# Patient Record
Sex: Male | Born: 1965 | Race: Black or African American | Hispanic: No | Marital: Single | Smoking: Former smoker
Health system: Southern US, Community
[De-identification: ages and names within clinical notes are randomized; demographics above are authoritative.]

## PROBLEM LIST (undated history)

## (undated) DIAGNOSIS — J45909 Unspecified asthma, uncomplicated: Secondary | ICD-10-CM

## (undated) DIAGNOSIS — M1990 Unspecified osteoarthritis, unspecified site: Secondary | ICD-10-CM

## (undated) DIAGNOSIS — I10 Essential (primary) hypertension: Secondary | ICD-10-CM

## (undated) HISTORY — PX: OTHER SURGICAL HISTORY: SHX169

---

## 2013-07-13 DIAGNOSIS — M545 Low back pain, unspecified: Secondary | ICD-10-CM | POA: Diagnosis not present

## 2013-07-13 DIAGNOSIS — R1031 Right lower quadrant pain: Secondary | ICD-10-CM | POA: Diagnosis not present

## 2013-07-13 DIAGNOSIS — E039 Hypothyroidism, unspecified: Secondary | ICD-10-CM | POA: Diagnosis not present

## 2013-07-13 DIAGNOSIS — N281 Cyst of kidney, acquired: Secondary | ICD-10-CM | POA: Diagnosis not present

## 2013-07-13 DIAGNOSIS — E538 Deficiency of other specified B group vitamins: Secondary | ICD-10-CM | POA: Diagnosis not present

## 2013-07-13 DIAGNOSIS — R7989 Other specified abnormal findings of blood chemistry: Secondary | ICD-10-CM | POA: Diagnosis not present

## 2013-07-13 DIAGNOSIS — K7689 Other specified diseases of liver: Secondary | ICD-10-CM | POA: Diagnosis not present

## 2013-07-13 DIAGNOSIS — M4800 Spinal stenosis, site unspecified: Secondary | ICD-10-CM | POA: Diagnosis not present

## 2013-07-13 DIAGNOSIS — E559 Vitamin D deficiency, unspecified: Secondary | ICD-10-CM | POA: Diagnosis not present

## 2013-07-13 DIAGNOSIS — M542 Cervicalgia: Secondary | ICD-10-CM | POA: Diagnosis not present

## 2013-07-16 DIAGNOSIS — Z79899 Other long term (current) drug therapy: Secondary | ICD-10-CM | POA: Diagnosis not present

## 2013-07-16 DIAGNOSIS — M545 Low back pain, unspecified: Secondary | ICD-10-CM | POA: Diagnosis not present

## 2013-07-16 DIAGNOSIS — G894 Chronic pain syndrome: Secondary | ICD-10-CM | POA: Diagnosis not present

## 2013-07-16 DIAGNOSIS — R1031 Right lower quadrant pain: Secondary | ICD-10-CM | POA: Diagnosis not present

## 2013-07-16 DIAGNOSIS — M542 Cervicalgia: Secondary | ICD-10-CM | POA: Diagnosis not present

## 2013-07-16 DIAGNOSIS — M4800 Spinal stenosis, site unspecified: Secondary | ICD-10-CM | POA: Diagnosis not present

## 2013-07-16 DIAGNOSIS — E559 Vitamin D deficiency, unspecified: Secondary | ICD-10-CM | POA: Diagnosis not present

## 2013-07-16 DIAGNOSIS — J45909 Unspecified asthma, uncomplicated: Secondary | ICD-10-CM | POA: Diagnosis not present

## 2013-07-30 DIAGNOSIS — Z79899 Other long term (current) drug therapy: Secondary | ICD-10-CM | POA: Diagnosis not present

## 2013-07-30 DIAGNOSIS — M545 Low back pain, unspecified: Secondary | ICD-10-CM | POA: Diagnosis not present

## 2013-07-30 DIAGNOSIS — E559 Vitamin D deficiency, unspecified: Secondary | ICD-10-CM | POA: Diagnosis not present

## 2013-07-30 DIAGNOSIS — J45909 Unspecified asthma, uncomplicated: Secondary | ICD-10-CM | POA: Diagnosis not present

## 2013-07-30 DIAGNOSIS — G894 Chronic pain syndrome: Secondary | ICD-10-CM | POA: Diagnosis not present

## 2013-07-30 DIAGNOSIS — M4800 Spinal stenosis, site unspecified: Secondary | ICD-10-CM | POA: Diagnosis not present

## 2013-08-30 DIAGNOSIS — M4800 Spinal stenosis, site unspecified: Secondary | ICD-10-CM | POA: Diagnosis not present

## 2013-08-30 DIAGNOSIS — M545 Low back pain, unspecified: Secondary | ICD-10-CM | POA: Diagnosis not present

## 2013-08-30 DIAGNOSIS — J45909 Unspecified asthma, uncomplicated: Secondary | ICD-10-CM | POA: Diagnosis not present

## 2013-08-30 DIAGNOSIS — E559 Vitamin D deficiency, unspecified: Secondary | ICD-10-CM | POA: Diagnosis not present

## 2013-09-27 DIAGNOSIS — M545 Low back pain, unspecified: Secondary | ICD-10-CM | POA: Diagnosis not present

## 2013-09-27 DIAGNOSIS — M4800 Spinal stenosis, site unspecified: Secondary | ICD-10-CM | POA: Diagnosis not present

## 2013-09-27 DIAGNOSIS — J45909 Unspecified asthma, uncomplicated: Secondary | ICD-10-CM | POA: Diagnosis not present

## 2013-09-27 DIAGNOSIS — G4700 Insomnia, unspecified: Secondary | ICD-10-CM | POA: Diagnosis not present

## 2013-10-28 DIAGNOSIS — Z79899 Other long term (current) drug therapy: Secondary | ICD-10-CM | POA: Diagnosis not present

## 2013-10-28 DIAGNOSIS — J45909 Unspecified asthma, uncomplicated: Secondary | ICD-10-CM | POA: Diagnosis not present

## 2013-10-28 DIAGNOSIS — M545 Low back pain, unspecified: Secondary | ICD-10-CM | POA: Diagnosis not present

## 2013-10-28 DIAGNOSIS — G4700 Insomnia, unspecified: Secondary | ICD-10-CM | POA: Diagnosis not present

## 2013-10-28 DIAGNOSIS — M546 Pain in thoracic spine: Secondary | ICD-10-CM | POA: Diagnosis not present

## 2013-10-28 DIAGNOSIS — M4800 Spinal stenosis, site unspecified: Secondary | ICD-10-CM | POA: Diagnosis not present

## 2013-10-28 DIAGNOSIS — M542 Cervicalgia: Secondary | ICD-10-CM | POA: Diagnosis not present

## 2013-11-30 DIAGNOSIS — M545 Low back pain, unspecified: Secondary | ICD-10-CM | POA: Diagnosis not present

## 2013-11-30 DIAGNOSIS — M4800 Spinal stenosis, site unspecified: Secondary | ICD-10-CM | POA: Diagnosis not present

## 2013-11-30 DIAGNOSIS — J45909 Unspecified asthma, uncomplicated: Secondary | ICD-10-CM | POA: Diagnosis not present

## 2013-11-30 DIAGNOSIS — G4700 Insomnia, unspecified: Secondary | ICD-10-CM | POA: Diagnosis not present

## 2013-12-27 ENCOUNTER — Emergency Department (HOSPITAL_COMMUNITY): Payer: Medicare Other

## 2013-12-27 ENCOUNTER — Encounter (HOSPITAL_COMMUNITY): Payer: Self-pay | Admitting: Emergency Medicine

## 2013-12-27 ENCOUNTER — Inpatient Hospital Stay (HOSPITAL_COMMUNITY)
Admission: EM | Admit: 2013-12-27 | Discharge: 2013-12-31 | DRG: 189 | Disposition: A | Discharge status: Home / Self Care | Payer: Medicare Other | Attending: Pulmonary Disease | Admitting: Pulmonary Disease

## 2013-12-27 DIAGNOSIS — J45902 Unspecified asthma with status asthmaticus: Secondary | ICD-10-CM | POA: Diagnosis present

## 2013-12-27 DIAGNOSIS — R062 Wheezing: Secondary | ICD-10-CM | POA: Diagnosis not present

## 2013-12-27 DIAGNOSIS — I10 Essential (primary) hypertension: Secondary | ICD-10-CM | POA: Diagnosis present

## 2013-12-27 DIAGNOSIS — J4532 Mild persistent asthma with status asthmaticus: Secondary | ICD-10-CM

## 2013-12-27 DIAGNOSIS — J9600 Acute respiratory failure, unspecified whether with hypoxia or hypercapnia: Principal | ICD-10-CM | POA: Diagnosis present

## 2013-12-27 DIAGNOSIS — R0609 Other forms of dyspnea: Secondary | ICD-10-CM | POA: Diagnosis not present

## 2013-12-27 DIAGNOSIS — J9601 Acute respiratory failure with hypoxia: Secondary | ICD-10-CM

## 2013-12-27 DIAGNOSIS — R079 Chest pain, unspecified: Secondary | ICD-10-CM | POA: Diagnosis present

## 2013-12-27 DIAGNOSIS — I369 Nonrheumatic tricuspid valve disorder, unspecified: Secondary | ICD-10-CM | POA: Diagnosis not present

## 2013-12-27 DIAGNOSIS — G8929 Other chronic pain: Secondary | ICD-10-CM | POA: Diagnosis present

## 2013-12-27 DIAGNOSIS — Z4682 Encounter for fitting and adjustment of non-vascular catheter: Secondary | ICD-10-CM | POA: Diagnosis not present

## 2013-12-27 DIAGNOSIS — R0989 Other specified symptoms and signs involving the circulatory and respiratory systems: Secondary | ICD-10-CM | POA: Diagnosis not present

## 2013-12-27 DIAGNOSIS — Z6832 Body mass index (BMI) 32.0-32.9, adult: Secondary | ICD-10-CM

## 2013-12-27 DIAGNOSIS — R609 Edema, unspecified: Secondary | ICD-10-CM | POA: Diagnosis present

## 2013-12-27 DIAGNOSIS — R0603 Acute respiratory distress: Secondary | ICD-10-CM

## 2013-12-27 DIAGNOSIS — J45901 Unspecified asthma with (acute) exacerbation: Secondary | ICD-10-CM | POA: Diagnosis present

## 2013-12-27 DIAGNOSIS — Z981 Arthrodesis status: Secondary | ICD-10-CM | POA: Diagnosis not present

## 2013-12-27 DIAGNOSIS — R0602 Shortness of breath: Secondary | ICD-10-CM | POA: Diagnosis not present

## 2013-12-27 HISTORY — DX: Unspecified asthma, uncomplicated: J45.909

## 2013-12-27 HISTORY — DX: Essential (primary) hypertension: I10

## 2013-12-27 LAB — CBC WITH DIFFERENTIAL/PLATELET
BASOS PCT: 0 % (ref 0–1)
Basophils Absolute: 0 10*3/uL (ref 0.0–0.1)
EOS ABS: 1.1 10*3/uL — AB (ref 0.0–0.7)
EOS PCT: 8 % — AB (ref 0–5)
HCT: 43.1 % (ref 39.0–52.0)
Hemoglobin: 15 g/dL (ref 13.0–17.0)
Lymphocytes Relative: 27 % (ref 12–46)
Lymphs Abs: 4.1 10*3/uL — ABNORMAL HIGH (ref 0.7–4.0)
MCH: 31.4 pg (ref 26.0–34.0)
MCHC: 34.8 g/dL (ref 30.0–36.0)
MCV: 90.2 fL (ref 78.0–100.0)
Monocytes Absolute: 1.2 10*3/uL — ABNORMAL HIGH (ref 0.1–1.0)
Monocytes Relative: 8 % (ref 3–12)
Neutro Abs: 8.8 10*3/uL — ABNORMAL HIGH (ref 1.7–7.7)
Neutrophils Relative %: 57 % (ref 43–77)
PLATELETS: 326 10*3/uL (ref 150–400)
RBC: 4.78 MIL/uL (ref 4.22–5.81)
RDW: 13 % (ref 11.5–15.5)
WBC: 15.2 10*3/uL — ABNORMAL HIGH (ref 4.0–10.5)

## 2013-12-27 LAB — BASIC METABOLIC PANEL
BUN: 12 mg/dL (ref 6–23)
CALCIUM: 8.7 mg/dL (ref 8.4–10.5)
CO2: 25 mEq/L (ref 19–32)
Chloride: 102 mEq/L (ref 96–112)
Creatinine, Ser: 0.94 mg/dL (ref 0.50–1.35)
GLUCOSE: 119 mg/dL — AB (ref 70–99)
POTASSIUM: 3.5 meq/L — AB (ref 3.7–5.3)
Sodium: 141 mEq/L (ref 137–147)

## 2013-12-27 LAB — TROPONIN I
Troponin I: <0.3 ng/mL (ref <0.30)
Troponin I: <0.3 ng/mL (ref <0.30)

## 2013-12-27 MED ORDER — ALBUTEROL SULFATE (2.5 MG/3ML) 0.083% IN NEBU
10.0000 mg | INHALATION_SOLUTION | Freq: Once | RESPIRATORY_TRACT | Status: AC
  Administered 2013-12-27: 10 mg via RESPIRATORY_TRACT
  Filled 2013-12-27: qty 12

## 2013-12-27 MED ORDER — PANTOPRAZOLE SODIUM 40 MG IV SOLR
40.0000 mg | INTRAVENOUS | Status: DC
  Administered 2013-12-28 (×2): 40 mg via INTRAVENOUS
  Filled 2013-12-27 (×2): qty 40

## 2013-12-27 MED ORDER — SODIUM CHLORIDE 0.9 % IV SOLN: 250.0000 mL | INTRAVENOUS | Status: DC | PRN

## 2013-12-27 MED ORDER — AMLODIPINE BESYLATE 10 MG PO TABS
10.0000 mg | ORAL_TABLET | Freq: Every day | ORAL | Status: DC
  Administered 2013-12-28 – 2013-12-31 (×4): 10 mg via ORAL
  Filled 2013-12-27 (×4): qty 1

## 2013-12-27 MED ORDER — ALBUTEROL SULFATE (2.5 MG/3ML) 0.083% IN NEBU
2.5000 mg | INHALATION_SOLUTION | RESPIRATORY_TRACT | Status: DC | PRN
  Administered 2013-12-29: 2.5 mg via RESPIRATORY_TRACT
  Filled 2013-12-27: qty 3

## 2013-12-27 MED ORDER — IPRATROPIUM-ALBUTEROL 0.5-2.5 (3) MG/3ML IN SOLN
3.0000 mL | RESPIRATORY_TRACT | Status: DC
  Administered 2013-12-27: 3 mL via RESPIRATORY_TRACT
  Filled 2013-12-27 (×2): qty 3

## 2013-12-27 MED ORDER — KETOROLAC TROMETHAMINE 30 MG/ML IJ SOLN
30.0000 mg | Freq: Once | INTRAMUSCULAR | Status: AC
  Administered 2013-12-28: 30 mg via INTRAVENOUS
  Filled 2013-12-27: qty 1

## 2013-12-27 MED ORDER — ENOXAPARIN SODIUM 40 MG/0.4ML ~~LOC~~ SOLN
40.0000 mg | Freq: Every day | SUBCUTANEOUS | Status: DC
  Administered 2013-12-27 – 2013-12-30 (×4): 40 mg via SUBCUTANEOUS
  Filled 2013-12-27 (×6): qty 0.4

## 2013-12-27 MED ORDER — ACETAMINOPHEN 325 MG PO TABS: 650.0000 mg | ORAL_TABLET | Freq: Four times a day (QID) | ORAL | Status: DC | PRN

## 2013-12-27 MED ORDER — METHYLPREDNISOLONE SODIUM SUCC 125 MG IJ SOLR
125.0000 mg | Freq: Three times a day (TID) | INTRAMUSCULAR | Status: DC
  Administered 2013-12-28 (×2): 125 mg via INTRAVENOUS
  Filled 2013-12-27 (×2): qty 2

## 2013-12-27 MED ORDER — SODIUM CHLORIDE 0.9 % IV SOLN
INTRAVENOUS | Status: DC
  Administered 2013-12-28: via INTRAVENOUS

## 2013-12-27 MED ORDER — IPRATROPIUM BROMIDE 0.02 % IN SOLN
0.5000 mg | Freq: Once | RESPIRATORY_TRACT | Status: AC
  Administered 2013-12-27: 0.5 mg via RESPIRATORY_TRACT
  Filled 2013-12-27: qty 2.5

## 2013-12-27 NOTE — ED Notes (Signed)
Called ICU to give report, primary nurse will return call.

## 2013-12-27 NOTE — ED Notes (Signed)
MD at bedside. Intensivist at bedside.

## 2013-12-27 NOTE — ED Notes (Signed)
MD at bedside. 

## 2013-12-27 NOTE — ED Notes (Signed)
RT notified re ABG and biPap by Jenn Ox

## 2013-12-27 NOTE — Progress Notes (Signed)
RT placed patient on BIPAP for Work of breathing. BIPAP settings are 12/6, rate of 12, and O2 of 70%. Patient is tolerating well. RT will continue to monitor.

## 2013-12-27 NOTE — ED Provider Notes (Signed)
CSN: [id number]     Arrival date & time 12/27/13  1925 History   First MD Initiated Contact with Patient 12/27/13 1937     Chief Complaint  Patient presents with  . Respiratory Distress      HPI Patient presents emergency Department with respiratory distress.  He states he has a history of asthma.  He's been trying his breathing treatments without improvement of symptoms.  He states he's been staying in a new house over the past several days that was not clean, described as "unsanitary".  Denies tobacco abuse.    Past Medical History  Diagnosis Date  . Asthma   . Hypertension    Past Surgical History  Procedure Laterality Date  . Neck fusion     History reviewed. No pertinent family history. History  Substance Use Topics  . Smoking status: Never Smoker   . Smokeless tobacco: Not on file  . Alcohol Use: Yes    Review of Systems  All other systems reviewed and are negative.     Allergies  Review of patient's allergies indicates no known allergies.  Home Medications   Prior to Admission medications   Medication Sig Start Date End Date Taking? Authorizing Provider  albuterol (PROVENTIL) (2.5 MG/3ML) 0.083% nebulizer solution Take 2.5 mg by nebulization every 6 (six) hours as needed for wheezing or shortness of breath.   Yes Historical Provider, MD   BP 169/102  Pulse 117  Resp 30  SpO2 94% Physical Exam  Nursing note and vitals reviewed. Constitutional: He is oriented to person, place, and time. He appears well-developed and well-nourished.  HENT:  Head: Normocephalic and atraumatic.  Eyes: EOM are normal.  Neck: Normal range of motion.  Cardiovascular: Regular rhythm and normal heart sounds.   Tachycardia  Pulmonary/Chest: Accessory muscle usage present. Tachypnea noted. He is in respiratory distress.  Wheezing bilaterally.  Decreased breath sounds in the bases  Abdominal: Soft. He exhibits no distension. There is no tenderness.  Musculoskeletal: Normal  range of motion.  Neurological: He is alert and oriented to person, place, and time.  Skin: Skin is warm and dry.  Psychiatric: He has a normal mood and affect. Judgment normal.    ED Course  Procedures (including critical care time) CRITICAL CARE Performed by: Lyanne CoAMPOS,KEVIN M Total critical care time: 35 Critical care time was exclusive of separately billable procedures and treating other patients. Critical care was necessary to treat or prevent imminent or life-threatening deterioration. Critical care was time spent personally by me on the following activities: development of treatment plan with patient and/or surrogate as well as nursing, discussions with consultants, evaluation of patient's response to treatment, examination of patient, obtaining history from patient or surrogate, ordering and performing treatments and interventions, ordering and review of laboratory studies, ordering and review of radiographic studies, pulse oximetry and re-evaluation of patient's condition.   Labs Review Labs Reviewed  CBC WITH DIFFERENTIAL - Abnormal; Notable for the following:    WBC 15.2 (*)    Neutro Abs 8.8 (*)    Lymphs Abs 4.1 (*)    Monocytes Absolute 1.2 (*)    Eosinophils Relative 8 (*)    Eosinophils Absolute 1.1 (*)    All other components within normal limits  BLOOD GAS, ARTERIAL  BASIC METABOLIC PANEL  TROPONIN I    Imaging Review Dg Chest Portable 1 View  12/27/2013   CLINICAL DATA:  Respiratory distress  EXAM: PORTABLE CHEST - 1 VIEW  COMPARISON:  None.  FINDINGS: 1945 hrs.  No focal airspace consolidation, edema, pleural effusion, pneumothorax. The cardiopericardial silhouette is within normal limits for size. Imaged bony structures of the thorax are intact. Telemetry leads overlie the chest. Patient is status post ACDF with plating in the lower cervical spine.  IMPRESSION: No acute cardiopulmonary findings.   Electronically Signed   By: Kennith CenterEric  Mansell M.D.   On: 12/27/2013 19:55   I personally reviewed the imaging tests through PACS system I reviewed available ER/hospitalization records through the EMR    EKG Interpretation   Date/Time:  Monday December 27 2013 19:31:52 EDT Ventricular Rate:  117 PR Interval:  146 QRS Duration: 85 QT Interval:  328 QTC Calculation: 458 R Axis:   62 Text Interpretation:  Sinus tachycardia No old tracing to compare  Confirmed by CAMPOS  MD, Caryn BeeKEVIN ([id number]) on 12/27/2013 9:15:05 PM      MDM   Final diagnoses:  Asthma exacerbation  Respiratory distress    Severe asthma exacerbation.  Patient is very received Solu-Medrol, magnesium, epinephrine, albuterol by EMS.  Patient continues with severe wheezing and shortness of breath at this time.  ABG and continue his treatment.  Patient be placed on BiPAP.  9:12 PM At is a patient of BiPAP however this has significantly worsened his breathing again.  He we placed back on BiPAP.  He'll need to be admitted to the intensive care unit.  Lyanne CoKevin M Campos, MD 12/27/13 2115

## 2013-12-27 NOTE — H&P (Signed)
PULMONARY / CRITICAL CARE MEDICINE   Name: Francisco Ramos MRN: [id number] DOB: 05/15/1966    ADMISSION DATE:  12/27/2013 CONSULTATION DATE:  12/27/2013  REFERRING MD :  EDP PRIMARY SERVICE: PCCM  CHIEF COMPLAINT:  Dyspnea  BRIEF PATIENT DESCRIPTION: 48 year old male with PMH significant for asthma presented to [facility] 6/22 in acute respiratory distress. In ED he was given nebulized BD's and was started on BiPAP. PCCM asked to see.   SIGNIFICANT EVENTS / STUDIES:  6/22 Admitted on BiPAP  LINES / TUBES: PIV  CULTURES: n/a  ANTIBIOTICS: None  HISTORY OF PRESENT ILLNESS:  48 year old male with history of athsma and HTN presented to [facility] 6/22 in acute respiratory distress. Recent move [place] to [place]. Reports that he was staying in a new place over the last week. He described this place as "unsanitary" to ED staff. During that time he began with a non-productive cough and intermittent asthma exacerbations. He uses flovent daily and PRN albuterol for flares. States he has been well controlled for the past 15 years but this past week he used his albuterol frequently. 6/22 developed increased SOB accompanied by bilateral chest pain. Also noted some pedal edema. In ED was given albuterol nebulizer and placed on BiPAP with relief. PCCM to admit.  H/o mild persistent symptoms- last steroid use 15 yrs ago, last ER visit in childhood  PAST MEDICAL HISTORY :  Past Medical History  Diagnosis Date  . Asthma   . Hypertension    Past Surgical History  Procedure Laterality Date  . Neck fusion     Prior to Admission medications   Medication Sig Start Date End Date Taking? Authorizing Sophya Vanblarcom  albuterol (PROVENTIL HFA;VENTOLIN HFA) 108 (90 BASE) MCG/ACT inhaler Inhale 2 puffs into the lungs every 6 (six) hours as needed for wheezing or shortness of breath.   Yes Historical Keyton Bhat, MD  albuterol (PROVENTIL) (2.5 MG/3ML) 0.083% nebulizer solution Take 2.5 mg by nebulization every 6 (six) hours as  needed for wheezing or shortness of breath.   Yes Historical Quantisha Marsicano, MD  amLODipine (NORVASC) 10 MG tablet Take 10 mg by mouth daily.   Yes Historical Naesha Buckalew, MD  fluticasone (FLONASE) 50 MCG/ACT nasal spray Place 1 spray into both nostrils daily.   Yes Historical Liz Pinho, MD  fluticasone (FLOVENT HFA) 110 MCG/ACT inhaler Inhale 2 puffs into the lungs 2 (two) times daily.   Yes Historical Kemauri Musa, MD  montelukast (SINGULAIR) 10 MG tablet Take 10 mg by mouth at bedtime.   Yes Historical Alston Berrie, MD   No Known Allergies  FAMILY HISTORY:  History reviewed. No pertinent family history. SOCIAL HISTORY:  reports that he has never smoked. He does not have any smokeless tobacco history on file. He reports that he drinks alcohol. He reports that he does not use illicit drugs.  REVIEW OF SYSTEMS:   Bolds are positive  Constitutional: weight loss, gain, night sweats, Fevers, chills, fatigue .  HEENT: headaches, Sore throat, sneezing, nasal congestion, post nasal drip, Difficulty swallowing, Tooth/dental problems, visual complaints visual changes, ear ache CV:  chest pain, radiates: ,Orthopnea, PND, swelling in lower extremities, dizziness, palpitations, syncope.  GI  heartburn, indigestion, abdominal pain, nausea, vomiting, diarrhea, change in bowel habits, loss of appetite, bloody stools.  Resp: cough, non-productive: , hemoptysis, dyspnea, chest pain, pleuritic.  Skin: rash or itching or icterus GU: dysuria, change in color of urine, urgency or frequency. flank pain, hematuria  MS: joint pain, BLE edema. decreased range of motion  Psych:  change in mood or affect. depression or anxiety.  Neuro: difficulty with speech, weakness, numbness, ataxia    SUBJECTIVE:   VITAL SIGNS: Pulse Rate:  [110-117] 110 (06/22 2100) Resp:  [20-31] 25 (06/22 2100) BP: (138-170)/(89-110) 145/92 mmHg (06/22 2100) SpO2:  [91 %-100 %] 99 % (06/22 2100) HEMODYNAMICS:   VENTILATOR SETTINGS:   INTAKE /  OUTPUT: Intake/Output   None     PHYSICAL EXAMINATION: General:  Male on NIMV in no acute distress Neuro:  Alert, oriented x 4. No focal deficits HEENT:  [place]/AT, PERRL, No JVD noted Cardiovascular:  Tachy, regular Lungs:  Scattered rhonchi, expiratory wheeze Abdomen:  Soft, non-tender Musculoskeletal:  No acute deformity or ROM limitation Skin:  BLE 2+ pitting edema  LABS:  CBC  Recent Labs Lab 12/27/13 1943  WBC 15.2*  HGB 15.0  HCT 43.1  PLT 326   Coag's No results found for this basename: APTT, INR,  in the last 168 hours BMET  Recent Labs Lab 12/27/13 1943  NA 141  K 3.5*  CL 102  CO2 25  BUN 12  CREATININE 0.94  GLUCOSE 119*   Electrolytes  Recent Labs Lab 12/27/13 1943  CALCIUM 8.7   Sepsis Markers No results found for this basename: LATICACIDVEN, PROCALCITON, O2SATVEN,  in the last 168 hours ABG No results found for this basename: PHART, PCO2ART, PO2ART,  in the last 168 hours Liver Enzymes No results found for this basename: AST, ALT, ALKPHOS, BILITOT, ALBUMIN,  in the last 168 hours Cardiac Enzymes  Recent Labs Lab 12/27/13 1943  TROPONINI <0.30   Glucose No results found for this basename: GLUCAP,  in the last 168 hours  Imaging Dg Chest Portable 1 View  12/27/2013   CLINICAL DATA:  Respiratory distress  EXAM: PORTABLE CHEST - 1 VIEW  COMPARISON:  None.  FINDINGS: 1945 hrs. No focal airspace consolidation, edema, pleural effusion, pneumothorax. The cardiopericardial silhouette is within normal limits for size. Imaged bony structures of the thorax are intact. Telemetry leads overlie the chest. Patient is status post ACDF with plating in the lower cervical spine.  IMPRESSION: No acute cardiopulmonary findings.   Electronically Signed   By: Kennith CenterEric  Mansell M.D.   On: 12/27/2013 19:55     CXR: No acute cardiopulmonary process  ASSESSMENT / PLAN:  PULMONARY A: Acute respiratory failure 2nd to asthma Status Asthmaticus  P:   BiPAP -  likely wean to nasal cannula 6/23 AM Scheduled Duoneb with PRNs Solumedrol 125 q 8h  CARDIOVASCULAR A:  Chest pain Bilateral lower extremity edema H/o HTN  P:  Trend troponin 2D echo Check proBNP Continue home dose amlodipine  GASTROINTESTINAL: A: GI ppx  P: PPI NPO with sips while on BiPAP  HEMATOLOGIC A:   VTE ppx  P:  Lovenox    Joneen RoachPaul Hoffman, ACNP [facility] Pager [phone] or [phone]   Summary - h/o mild persistent symptoms, trigger seems to be weather/location change with perhaps exposure to cat dander, improving but may need NIV overnight for ventilatory support. Pedal edema unexplained  I have personally obtained a history, examined the patient, evaluated laboratory and imaging results, formulated the assessment and plan and placed orders. CRITICAL CARE: The patient is critically ill with multiple organ systems failure and requires high complexity decision making for assessment and support, frequent evaluation and titration of therapies, application of advanced monitoring technologies and extensive interpretation of multiple databases. Critical Care Time devoted to patient care services described in this note is 50 minutes.  Cyril Mourning MD. Tonny Bollman. [facility] Pager [phone] If no response call [phone]

## 2013-12-27 NOTE — ED Notes (Signed)
Per EMs, pt from home, reports resp. Distress.  Pt has hx of asthma.  Has used neb txs without relief.

## 2013-12-27 NOTE — ED Notes (Signed)
Bed: RESB Expected date:  Expected time:  Means of arrival:  Comments: EMS-respiratory distress 

## 2013-12-27 NOTE — ED Notes (Signed)
Pt reports SOB for the past week.  Gotten worse tonight.  Has had several neb txs at home without relief.  Pt has hx of asthma.  Pt reports chest tightness as well.  Pt also received albuterol neb tx with epi, .3mg  epi IM, 2gm Mag sulfate IV, 125mg  solumedrol IV and a duoneb en route.

## 2013-12-28 DIAGNOSIS — J9600 Acute respiratory failure, unspecified whether with hypoxia or hypercapnia: Secondary | ICD-10-CM | POA: Diagnosis not present

## 2013-12-28 DIAGNOSIS — J45902 Unspecified asthma with status asthmaticus: Secondary | ICD-10-CM | POA: Diagnosis not present

## 2013-12-28 DIAGNOSIS — I369 Nonrheumatic tricuspid valve disorder, unspecified: Secondary | ICD-10-CM

## 2013-12-28 LAB — BLOOD GAS, ARTERIAL
Acid-Base Excess: 0.1 mmol/L (ref 0.0–2.0)
Bicarbonate: 26.1 mEq/L — ABNORMAL HIGH (ref 20.0–24.0)
DRAWN BY: 11249
O2 Content: 8 L/min
O2 SAT: 92.4 %
PATIENT TEMPERATURE: 98.6
PO2 ART: 68.5 mmHg — AB (ref 80.0–100.0)
TCO2: 22.8 mmol/L (ref 0–100)
pCO2 arterial: 49.1 mmHg — ABNORMAL HIGH (ref 35.0–45.0)
pH, Arterial: 7.345 — ABNORMAL LOW (ref 7.350–7.450)

## 2013-12-28 LAB — BASIC METABOLIC PANEL
BUN: 14 mg/dL (ref 6–23)
CO2: 23 mEq/L (ref 19–32)
CREATININE: 0.87 mg/dL (ref 0.50–1.35)
Calcium: 8.9 mg/dL (ref 8.4–10.5)
Chloride: 100 mEq/L (ref 96–112)
GFR calc Af Amer: >90 mL/min (ref >90)
Glucose, Bld: 173 mg/dL — ABNORMAL HIGH (ref 70–99)
Potassium: 4 mEq/L (ref 3.7–5.3)
Sodium: 139 mEq/L (ref 137–147)

## 2013-12-28 LAB — CBC
HEMATOCRIT: 41.5 % (ref 39.0–52.0)
Hemoglobin: 14.4 g/dL (ref 13.0–17.0)
MCH: 31.2 pg (ref 26.0–34.0)
MCHC: 34.7 g/dL (ref 30.0–36.0)
MCV: 90 fL (ref 78.0–100.0)
Platelets: 328 10*3/uL (ref 150–400)
RBC: 4.61 MIL/uL (ref 4.22–5.81)
RDW: 13.1 % (ref 11.5–15.5)
WBC: 10 10*3/uL (ref 4.0–10.5)

## 2013-12-28 LAB — PRO B NATRIURETIC PEPTIDE: Pro B Natriuretic peptide (BNP): 34.6 pg/mL (ref 0–125)

## 2013-12-28 LAB — MRSA PCR SCREENING: MRSA BY PCR: NEGATIVE

## 2013-12-28 LAB — TROPONIN I: Troponin I: <0.3 ng/mL (ref <0.30)

## 2013-12-28 MED ORDER — HYDRALAZINE HCL 20 MG/ML IJ SOLN: 5.0000 mg | INTRAMUSCULAR | Status: DC | PRN

## 2013-12-28 MED ORDER — CHLORHEXIDINE GLUCONATE 0.12 % MT SOLN
15.0000 mL | Freq: Two times a day (BID) | OROMUCOSAL | Status: DC
  Administered 2013-12-28 – 2013-12-31 (×6): 15 mL via OROMUCOSAL
  Filled 2013-12-28 (×9): qty 15

## 2013-12-28 MED ORDER — BIOTENE DRY MOUTH MT LIQD
15.0000 mL | Freq: Two times a day (BID) | OROMUCOSAL | Status: DC
  Administered 2013-12-28 – 2013-12-31 (×7): 15 mL via OROMUCOSAL

## 2013-12-28 MED ORDER — HYDRALAZINE HCL 20 MG/ML IJ SOLN
10.0000 mg | INTRAMUSCULAR | Status: DC | PRN
  Filled 2013-12-28 (×2): qty 1

## 2013-12-28 MED ORDER — AZITHROMYCIN 500 MG PO TABS
500.0000 mg | ORAL_TABLET | Freq: Every day | ORAL | Status: DC
  Administered 2013-12-28 – 2013-12-31 (×4): 500 mg via ORAL
  Filled 2013-12-28: qty 1
  Filled 2013-12-28: qty 2
  Filled 2013-12-28 (×2): qty 1

## 2013-12-28 MED ORDER — GUAIFENESIN ER 600 MG PO TB12
1200.0000 mg | ORAL_TABLET | Freq: Two times a day (BID) | ORAL | Status: DC
  Administered 2013-12-28 – 2013-12-31 (×6): 1200 mg via ORAL
  Filled 2013-12-28 (×9): qty 2

## 2013-12-28 MED ORDER — PNEUMOCOCCAL VAC POLYVALENT 25 MCG/0.5ML IJ INJ
0.5000 mL | INJECTION | INTRAMUSCULAR | Status: AC
  Administered 2013-12-29: 0.5 mL via INTRAMUSCULAR
  Filled 2013-12-28 (×2): qty 0.5

## 2013-12-28 MED ORDER — FLUTICASONE PROPIONATE 50 MCG/ACT NA SUSP
2.0000 | Freq: Two times a day (BID) | NASAL | Status: DC
  Administered 2013-12-28 – 2013-12-31 (×7): 2 via NASAL
  Filled 2013-12-28: qty 16

## 2013-12-28 MED ORDER — BUDESONIDE 0.5 MG/2ML IN SUSP
0.5000 mg | Freq: Two times a day (BID) | RESPIRATORY_TRACT | Status: DC
  Administered 2013-12-28 – 2013-12-31 (×7): 0.5 mg via RESPIRATORY_TRACT
  Filled 2013-12-28 (×12): qty 2

## 2013-12-28 MED ORDER — ARFORMOTEROL TARTRATE 15 MCG/2ML IN NEBU
15.0000 ug | INHALATION_SOLUTION | Freq: Two times a day (BID) | RESPIRATORY_TRACT | Status: DC
  Administered 2013-12-28 – 2013-12-31 (×7): 15 ug via RESPIRATORY_TRACT
  Filled 2013-12-28 (×10): qty 2

## 2013-12-28 MED ORDER — METHYLPREDNISOLONE SODIUM SUCC 125 MG IJ SOLR
80.0000 mg | Freq: Three times a day (TID) | INTRAMUSCULAR | Status: DC
  Administered 2013-12-28 – 2013-12-29 (×3): 80 mg via INTRAVENOUS
  Filled 2013-12-28 (×3): qty 2

## 2013-12-28 MED ORDER — LORATADINE 10 MG PO TABS
10.0000 mg | ORAL_TABLET | Freq: Every day | ORAL | Status: DC
  Administered 2013-12-28 – 2013-12-31 (×4): 10 mg via ORAL
  Filled 2013-12-28 (×4): qty 1

## 2013-12-28 MED ORDER — FUROSEMIDE 10 MG/ML IJ SOLN
20.0000 mg | Freq: Once | INTRAMUSCULAR | Status: AC
  Administered 2013-12-28: 20 mg via INTRAVENOUS
  Filled 2013-12-28: qty 2

## 2013-12-28 MED ORDER — OXYCODONE HCL 5 MG PO TABS
10.0000 mg | ORAL_TABLET | Freq: Three times a day (TID) | ORAL | Status: DC | PRN
  Administered 2013-12-28 – 2013-12-31 (×8): 10 mg via ORAL
  Filled 2013-12-28 (×8): qty 2

## 2013-12-28 MED ORDER — CHLORHEXIDINE GLUCONATE 0.12 % MT SOLN: 15.0000 mL | Freq: Two times a day (BID) | OROMUCOSAL | Status: DC

## 2013-12-28 NOTE — Progress Notes (Signed)
PULMONARY / CRITICAL CARE MEDICINE   Name: Francisco Ramos MRN: [id number] DOB: 05/15/1966    ADMISSION DATE:  12/27/2013 CONSULTATION DATE:  12/27/2013  REFERRING MD :  EDP PRIMARY SERVICE: PCCM  CHIEF COMPLAINT:  Dyspnea  BRIEF PATIENT DESCRIPTION: 48 year old male with PMH significant for asthma presented to [facility] 6/22 in acute respiratory distress. In ED he was given nebulized BD's and was started on BiPAP. PCCM asked to see.   SIGNIFICANT EVENTS / STUDIES:  6/22 Admitted on BiPAP  LINES / TUBES: PIV  CULTURES: n/a  ANTIBIOTICS: azithro 6/23>>>   SUBJECTIVE:  Improved No distress  VITAL SIGNS: Temp:  [97 F (36.1 C)-97.6 F (36.4 C)] 97 F (36.1 C) (06/23 0400) Pulse Rate:  [102-117] 108 (06/23 0830) Resp:  [16-31] 20 (06/23 0830) BP: (132-170)/(84-115) 165/115 mmHg (06/23 0902) SpO2:  [91 %-100 %] 94 % (06/23 0830) FiO2 (%):  [100 %] 100 % (06/22 2350) Weight:  [93.3 kg (205 lb 11 oz)-93.4 kg (205 lb 14.6 oz)] 93.3 kg (205 lb 11 oz) (06/23 0400) HEMODYNAMICS:   VENTILATOR SETTINGS: Vent Mode:  [-]  FiO2 (%):  [100 %] 100 % INTAKE / OUTPUT: Intake/Output     06/22 0701 - 06/23 0700 06/23 0701 - 06/24 0700   I.V. (mL/kg) 340.8 (3.7) 50 (0.5)   Total Intake(mL/kg) 340.8 (3.7) 50 (0.5)   Urine (mL/kg/hr) 500    Total Output 500     Net -159.2 +50          PHYSICAL EXAMINATION: General:  No acute distress. Still w/ increased resp efforts  Neuro:  Alert, oriented x 4. No focal deficits HEENT:  [name], PERRL, No JVD noted Cardiovascular:  Tachy, regular Lungs:  Scattered rhonchi, expiratory wheeze, upper and lower airway  Abdomen:  Soft, non-tender Musculoskeletal:  No acute deformity or ROM limitation Skin:  BLE 2+ pitting edema  LABS:  CBC  Recent Labs Lab 12/27/13 1943 12/28/13 0350  WBC 15.2* 10.0  HGB 15.0 14.4  HCT 43.1 41.5  PLT 326 328   Coag's No results found for this basename: APTT, INR,  in the last 168 hours BMET  Recent  Labs Lab 12/27/13 1943 12/28/13 0350  NA 141 139  K 3.5* 4.0  CL 102 100  CO2 25 23  BUN 12 14  CREATININE 0.94 0.87  GLUCOSE 119* 173*   Electrolytes  Recent Labs Lab 12/27/13 1943 12/28/13 0350  CALCIUM 8.7 8.9   Sepsis Markers No results found for this basename: LATICACIDVEN, PROCALCITON, O2SATVEN,  in the last 168 hours ABG  Recent Labs Lab 12/27/13 2001  PHART 7.345*  PCO2ART 49.1*  PO2ART 68.5*   Liver Enzymes No results found for this basename: AST, ALT, ALKPHOS, BILITOT, ALBUMIN,  in the last 168 hours Cardiac Enzymes  Recent Labs Lab 12/27/13 1943 12/27/13 2306 12/28/13 0350  TROPONINI <0.30 <0.30 <0.30  PROBNP 34.6  --   --    Glucose No results found for this basename: GLUCAP,  in the last 168 hours  Imaging Dg Chest Portable 1 View  12/27/2013   CLINICAL DATA:  Respiratory distress  EXAM: PORTABLE CHEST - 1 VIEW  COMPARISON:  None.  FINDINGS: 1945 hrs. No focal airspace consolidation, edema, pleural effusion, pneumothorax. The cardiopericardial silhouette is within normal limits for size. Imaged bony structures of the thorax are intact. Telemetry leads overlie the chest. Patient is status post ACDF with plating in the lower cervical spine.  IMPRESSION: No acute cardiopulmonary findings.   Electronically  Signed   By: Kennith CenterEric  Mansell M.D.   On: 12/27/2013 19:55     CXR: No acute cardiopulmonary process  ASSESSMENT / PLAN:  PULMONARY A: Acute respiratory failure 2nd to asthma Status Asthmaticus Productive cough (green/yellow sputum) P:   Wean FIo2 Change to brovana/budesonide  Cont prn SABA Solumedrol 80 8h Repeat CXR in am.  Add azithro   CARDIOVASCULAR A:  Chest pain Bilateral lower extremity edema ? amlodipin induced, BNP low H/o HTN P:  F/u 2D echo Continue home dose amlodipine Lasix 20 x1  GASTROINTESTINAL: A: GI ppx  P: PPI Adv diet   HEMATOLOGIC A:   VTE ppx  P:  Lovenox    Summary - h/o mild persistent  symptoms, trigger seems to be weather/location change with perhaps exposure to cat dander, improving  Pedal edema unexplained   Cyril Mourningakesh Alva MD. FCCP.  Pulmonary & Critical care Pager [phone] If no response call [phone]

## 2013-12-28 NOTE — Progress Notes (Signed)
PT not on BiPAP at this time- no respiratory distress at this time. RN aware.

## 2013-12-28 NOTE — Progress Notes (Signed)
CARE MANAGEMENT NOTE 12/28/2013  Patient:  Francisco Ramos,Francisco Ramos   Account Number:  [id number]  Date Initiated:  12/28/2013  Documentation initiated by:  DAVIS,RHONDA  Subjective/Objective Assessment:   resp distress requiring bipap/transitioned to nrb and then [place] fi02 at 100%/poss allergic reaction     Action/Plan:   home when stable   Anticipated DC Date:  12/31/2013   Anticipated DC Plan:  HOME/SELF CARE  In-house referral  NA      DC Planning Services  NA      [facility]   Choice offered to / List presented to:  NA      DME agency  NA        HH agency  NA   Status of service:  In process, will continue to follow Medicare Important Message given?  NA - LOS <3 / Initial given by admissions (If response is "NO", the following Medicare IM given date fields will be blank) Date Medicare IM given:  12/30/2013 Date Additional Medicare IM given:    Discharge Disposition:    Per UR Regulation:    If discussed at Long Length of Stay Meetings, dates discussed:    Comments:  [id number]/Rhonda Stark JockDavis, RN, BSN, CCM [phone] Chart Reviewed for discharge and hospital needs. Discharge needs at time of review: None present will follow for needs. IM UPDATE NEEDED ON [id number] IF REMAINS INPATIENT Review of patient progress due on [id number].

## 2013-12-28 NOTE — Progress Notes (Addendum)
eLink Physician-Brief Progress Note Patient Name: Francisco Ramos DOB: 03/15/1966 MRN: [id number]  Date of Service  12/28/2013   HPI/Events of Note   1, H R 120 - Advised RN to monitor    eICU Interventions  2. Change to SDU status; he looks fine from camera  3. Cough -start mucinex   Intervention Category Intermediate Interventions: Communication with other healthcare providers and/or family  RAMASWAMY,MURALI 12/28/2013, 4:28 PM

## 2013-12-28 NOTE — Progress Notes (Signed)
  Echocardiogram 2D Echocardiogram has been performed.  CHUNG, KWONG 12/28/2013, 12:12 PM

## 2013-12-29 ENCOUNTER — Inpatient Hospital Stay (HOSPITAL_COMMUNITY): Payer: Medicare Other

## 2013-12-29 DIAGNOSIS — Z4682 Encounter for fitting and adjustment of non-vascular catheter: Secondary | ICD-10-CM | POA: Diagnosis not present

## 2013-12-29 DIAGNOSIS — J9600 Acute respiratory failure, unspecified whether with hypoxia or hypercapnia: Secondary | ICD-10-CM | POA: Diagnosis not present

## 2013-12-29 DIAGNOSIS — J45902 Unspecified asthma with status asthmaticus: Secondary | ICD-10-CM | POA: Diagnosis not present

## 2013-12-29 LAB — COMPREHENSIVE METABOLIC PANEL
ALT: 32 U/L (ref 0–53)
AST: 22 U/L (ref 0–37)
Albumin: 3.9 g/dL (ref 3.5–5.2)
Alkaline Phosphatase: 57 U/L (ref 39–117)
BUN: 23 mg/dL (ref 6–23)
CO2: 24 meq/L (ref 19–32)
CREATININE: 1 mg/dL (ref 0.50–1.35)
Calcium: 9.3 mg/dL (ref 8.4–10.5)
Chloride: 100 mEq/L (ref 96–112)
GFR, EST NON AFRICAN AMERICAN: 89 mL/min — AB (ref >90)
Glucose, Bld: 136 mg/dL — ABNORMAL HIGH (ref 70–99)
Potassium: 4.3 mEq/L (ref 3.7–5.3)
Sodium: 140 mEq/L (ref 137–147)
Total Bilirubin: <0.2 mg/dL — ABNORMAL LOW (ref 0.3–1.2)
Total Protein: 7.5 g/dL (ref 6.0–8.3)

## 2013-12-29 LAB — CBC
HEMATOCRIT: 41.8 % (ref 39.0–52.0)
Hemoglobin: 14.3 g/dL (ref 13.0–17.0)
MCH: 31 pg (ref 26.0–34.0)
MCHC: 34.2 g/dL (ref 30.0–36.0)
MCV: 90.7 fL (ref 78.0–100.0)
PLATELETS: 332 10*3/uL (ref 150–400)
RBC: 4.61 MIL/uL (ref 4.22–5.81)
RDW: 13.4 % (ref 11.5–15.5)
WBC: 27.1 10*3/uL — AB (ref 4.0–10.5)

## 2013-12-29 MED ORDER — MONTELUKAST SODIUM 10 MG PO TABS
10.0000 mg | ORAL_TABLET | Freq: Every day | ORAL | Status: DC
  Administered 2013-12-29 – 2013-12-30 (×2): 10 mg via ORAL
  Filled 2013-12-29 (×4): qty 1

## 2013-12-29 MED ORDER — METHYLPREDNISOLONE SODIUM SUCC 125 MG IJ SOLR
80.0000 mg | Freq: Two times a day (BID) | INTRAMUSCULAR | Status: DC
  Administered 2013-12-29 – 2013-12-30 (×2): 80 mg via INTRAVENOUS
  Filled 2013-12-29 (×7): qty 1.28

## 2013-12-29 MED ORDER — FUROSEMIDE 40 MG PO TABS
40.0000 mg | ORAL_TABLET | Freq: Once | ORAL | Status: AC
  Administered 2013-12-29: 40 mg via ORAL
  Filled 2013-12-29: qty 1

## 2013-12-29 MED ORDER — CYCLOBENZAPRINE HCL 10 MG PO TABS
10.0000 mg | ORAL_TABLET | Freq: Two times a day (BID) | ORAL | Status: DC
  Administered 2013-12-29 – 2013-12-31 (×5): 10 mg via ORAL
  Filled 2013-12-29 (×8): qty 1

## 2013-12-29 MED ORDER — GABAPENTIN 300 MG PO CAPS
300.0000 mg | ORAL_CAPSULE | Freq: Three times a day (TID) | ORAL | Status: DC
  Administered 2013-12-29 – 2013-12-31 (×7): 300 mg via ORAL
  Filled 2013-12-29 (×12): qty 1

## 2013-12-29 NOTE — Progress Notes (Signed)
PULMONARY / CRITICAL CARE MEDICINE   Name: Francisco Ramos MRN: [id number] DOB: 08/11/1965    ADMISSION DATE:  12/27/2013 CONSULTATION DATE:  12/27/2013  REFERRING MD :  EDP PRIMARY SERVICE: PCCM  CHIEF COMPLAINT:  Dyspnea  BRIEF PATIENT DESCRIPTION: [age] year old male with PMH significant for asthma presented to [facility] 6/22 in acute respiratory distress. In ED he was given nebulized BD's and was started on BiPAP. PCCM asked to see.   SIGNIFICANT EVENTS / STUDIES:  6/22 Admitted on BiPAP  LINES / TUBES: PIV  CULTURES: n/a  ANTIBIOTICS: azithro 6/23>>>   SUBJECTIVE:  Improving C/o chronic  pain around neck, RUE Afebrile BP high  VITAL SIGNS: Temp:  [97.3 F (36.3 C)-98.5 F (36.9 C)] 97.9 F (36.6 C) (06/24 0800) Pulse Rate:  [101-121] 101 (06/24 0800) Resp:  [15-29] 15 (06/24 0800) BP: (130-184)/(78-138) 159/114 mmHg (06/24 0406) SpO2:  [88 %-96 %] 94 % (06/24 0800) HEMODYNAMICS:   VENTILATOR SETTINGS:   INTAKE / OUTPUT: Intake/Output     06/23 0701 - 06/24 0700 06/24 0701 - 06/25 0700   P.O. 1200    I.V. (mL/kg) 50 (0.5)    Total Intake(mL/kg) 1250 (13.4)    Urine (mL/kg/hr) 1375 (0.6)    Total Output 1375     Net -125            PHYSICAL EXAMINATION: General:  No acute distress.accessory muscles + Neuro:  Alert, oriented x 4. No focal deficits HEENT:  [place]/AT, PERRL, No JVD noted Cardiovascular:  Tachy, regular Lungs:  Scattered rhonchi, expiratory wheeze Abdomen:  Soft, non-tender Musculoskeletal:  No acute deformity or ROM limitation Skin:  BLE 1+ pitting edema  LABS:  CBC  Recent Labs Lab 12/27/13 1943 12/28/13 0350 12/29/13 0326  WBC 15.2* 10.0 27.1*  HGB 15.0 14.4 14.3  HCT 43.1 41.5 41.8  PLT 326 328 332   Coag's No results found for this basename: APTT, INR,  in the last 168 hours BMET  Recent Labs Lab 12/27/13 1943 12/28/13 0350 12/29/13 0326  NA 141 139 140  K 3.5* 4.0 4.3  CL 102 100 100  CO2 25 23 24   BUN 12 14 23    CREATININE 0.94 0.87 1.00  GLUCOSE 119* 173* 136*   Electrolytes  Recent Labs Lab 12/27/13 1943 12/28/13 0350 12/29/13 0326  CALCIUM 8.7 8.9 9.3   Sepsis Markers No results found for this basename: LATICACIDVEN, PROCALCITON, O2SATVEN,  in the last 168 hours ABG  Recent Labs Lab 12/27/13 2001  PHART 7.345*  PCO2ART 49.1*  PO2ART 68.5*   Liver Enzymes  Recent Labs Lab 12/29/13 0326  AST 22  ALT 32  ALKPHOS 57  BILITOT <0.2*  ALBUMIN 3.9   Cardiac Enzymes  Recent Labs Lab 12/27/13 1943 12/27/13 2306 12/28/13 0350  TROPONINI <0.30 <0.30 <0.30  PROBNP 34.6  --   --    Glucose No results found for this basename: GLUCAP,  in the last 168 hours  Imaging Dg Chest Port 1 View  12/29/2013   CLINICAL DATA:  Check endotracheal tube  EXAM: PORTABLE CHEST - 1 VIEW  COMPARISON:  12/27/2013  FINDINGS: Endotracheal tube has been removed previously. Lungs are well aerated and clear. No infiltrate effusion or edema.  IMPRESSION: No active disease.   Electronically Signed   By: Marlan Palauharles  Clark M.D.   On: 12/29/2013 07:50   Dg Chest Portable 1 View  12/27/2013   CLINICAL DATA:  Respiratory distress  EXAM: PORTABLE CHEST - 1 VIEW  COMPARISON:  None.  FINDINGS: 1945 hrs. No focal airspace consolidation, edema, pleural effusion, pneumothorax. The cardiopericardial silhouette is within normal limits for size. Imaged bony structures of the thorax are intact. Telemetry leads overlie the chest. Patient is status post ACDF with plating in the lower cervical spine.  IMPRESSION: No acute cardiopulmonary findings.   Electronically Signed   By: Kennith CenterEric  Mansell M.D.   On: 12/27/2013 19:55     CXR: No acute cardiopulmonary process  ASSESSMENT / PLAN:  PULMONARY A: Acute respiratory failure 2nd to asthma Status Asthmaticus Productive cough (green/yellow sputum) P:   Change to brovana/budesonide  Cont prn SABA Solumedrol 80 12h Ct azithro x 5ds total  CARDIOVASCULAR A:  Chest  pain Bilateral lower extremity edema ? amlodipin induced, BNP low, nml Lv fn H/o HTN P:  Continue home dose amlodipine Lasix 40 , start HCTZ tomorrow if BP remains high  GASTROINTESTINAL: A: GI ppx  P: PPI Adv diet   HEMATOLOGIC A:   VTE ppx  P:  Lovenox    Summary - h/o mild persistent symptoms, trigger seems to be weather/location change with perhaps exposure to cat dander, improving  Pedal edema related to amlodipin   Cyril Mourningakesh Alva MD. Tonny BollmanFCCP. [place] [facility] Pager [phone] If no response call [phone]

## 2013-12-29 NOTE — Progress Notes (Signed)
Patient transferring to room 1514.  Report called to Asher MuirJamie, Charity fundraiserN.  Patient to travel by wheelchair.  Will continue to monitor.

## 2013-12-30 DIAGNOSIS — J45901 Unspecified asthma with (acute) exacerbation: Secondary | ICD-10-CM

## 2013-12-30 DIAGNOSIS — J9600 Acute respiratory failure, unspecified whether with hypoxia or hypercapnia: Secondary | ICD-10-CM | POA: Diagnosis not present

## 2013-12-30 LAB — BASIC METABOLIC PANEL
BUN: 26 mg/dL — AB (ref 6–23)
CO2: 26 mEq/L (ref 19–32)
Calcium: 8.9 mg/dL (ref 8.4–10.5)
Chloride: 104 mEq/L (ref 96–112)
Creatinine, Ser: 0.92 mg/dL (ref 0.50–1.35)
Glucose, Bld: 127 mg/dL — ABNORMAL HIGH (ref 70–99)
POTASSIUM: 4.4 meq/L (ref 3.7–5.3)
Sodium: 142 mEq/L (ref 137–147)

## 2013-12-30 LAB — CBC
HEMATOCRIT: 39.5 % (ref 39.0–52.0)
HEMOGLOBIN: 13.2 g/dL (ref 13.0–17.0)
MCH: 30.9 pg (ref 26.0–34.0)
MCHC: 33.4 g/dL (ref 30.0–36.0)
MCV: 92.5 fL (ref 78.0–100.0)
Platelets: 309 10*3/uL (ref 150–400)
RBC: 4.27 MIL/uL (ref 4.22–5.81)
RDW: 13.4 % (ref 11.5–15.5)
WBC: 23.2 10*3/uL — AB (ref 4.0–10.5)

## 2013-12-30 MED ORDER — METHYLPREDNISOLONE SODIUM SUCC 125 MG IJ SOLR
60.0000 mg | Freq: Two times a day (BID) | INTRAMUSCULAR | Status: DC
  Administered 2013-12-30 – 2013-12-31 (×2): 60 mg via INTRAVENOUS
  Filled 2013-12-30 (×4): qty 0.96

## 2013-12-30 MED ORDER — FUROSEMIDE 20 MG PO TABS
20.0000 mg | ORAL_TABLET | Freq: Every day | ORAL | Status: DC
  Administered 2013-12-30 – 2013-12-31 (×2): 20 mg via ORAL
  Filled 2013-12-30 (×2): qty 1

## 2013-12-30 NOTE — Progress Notes (Signed)
PULMONARY / CRITICAL CARE MEDICINE   Name: Francisco Ramos MRN: [id number] DOB: 04/25/1966    ADMISSION DATE:  12/27/2013 CONSULTATION DATE:  12/27/2013  REFERRING MD :  EDP PRIMARY SERVICE: PCCM  CHIEF COMPLAINT:  Dyspnea  BRIEF PATIENT DESCRIPTION: 48 year old male with PMH significant for asthma presented to [facility] 6/22 in acute respiratory distress. In ED he was given nebulized BD's and was started on BiPAP. PCCM asked to see.   SIGNIFICANT EVENTS / STUDIES:  6/22 Admitted on BiPAP  LINES / TUBES: PIV  CULTURES: n/a  ANTIBIOTICS: azithro 6/23>>>   SUBJECTIVE:  Improving daily, but remains sob on moving around C/o chronic  pain around neck, RUE Afebrile BP better  VITAL SIGNS: Temp:  [96.8 F (36 C)-97.9 F (36.6 C)] 97.7 F (36.5 C) (06/25 0532) Pulse Rate:  [91-119] 91 (06/25 0532) Resp:  [20-22] 21 (06/25 0532) BP: (133-145)/(81-93) 133/93 mmHg (06/25 0532) SpO2:  [92 %-98 %] 98 % (06/25 0532) Weight:  [96.8 kg (213 lb 6.5 oz)] 96.8 kg (213 lb 6.5 oz) (06/25 0532) HEMODYNAMICS:   VENTILATOR SETTINGS:   INTAKE / OUTPUT: Intake/Output     06/24 0701 - 06/25 0700 06/25 0701 - 06/26 0700   P.O. 520    I.V. (mL/kg)     Total Intake(mL/kg) 520 (5.4)    Urine (mL/kg/hr)     Total Output       Net +520          Urine Occurrence 2 x      PHYSICAL EXAMINATION: General:  No acute distress, no accessory muscles  Neuro:  Alert, oriented x 4. No focal deficits HEENT:  [place]/AT, PERRL, No JVD noted Cardiovascular:   regular Lungs:  Scattered rhonchi, expiratory wheeze Abdomen:  Soft, non-tender Musculoskeletal:  No acute deformity or ROM limitation Skin:  BLE 1+ pitting edema  LABS:  CBC  Recent Labs Lab 12/28/13 0350 12/29/13 0326 12/30/13 0524  WBC 10.0 27.1* 23.2*  HGB 14.4 14.3 13.2  HCT 41.5 41.8 39.5  PLT 328 332 309   Coag's No results found for this basename: APTT, INR,  in the last 168 hours BMET  Recent Labs Lab 12/28/13 0350  12/29/13 0326 12/30/13 0524  NA 139 140 142  K 4.0 4.3 4.4  CL 100 100 104  CO2 23 24 26   BUN 14 23 26*  CREATININE 0.87 1.00 0.92  GLUCOSE 173* 136* 127*   Electrolytes  Recent Labs Lab 12/28/13 0350 12/29/13 0326 12/30/13 0524  CALCIUM 8.9 9.3 8.9   Sepsis Markers No results found for this basename: LATICACIDVEN, PROCALCITON, O2SATVEN,  in the last 168 hours ABG  Recent Labs Lab 12/27/13 2001  PHART 7.345*  PCO2ART 49.1*  PO2ART 68.5*   Liver Enzymes  Recent Labs Lab 12/29/13 0326  AST 22  ALT 32  ALKPHOS 57  BILITOT <0.2*  ALBUMIN 3.9   Cardiac Enzymes  Recent Labs Lab 12/27/13 1943 12/27/13 2306 12/28/13 0350  TROPONINI <0.30 <0.30 <0.30  PROBNP 34.6  --   --    Glucose No results found for this basename: GLUCAP,  in the last 168 hours  Imaging Dg Chest Port 1 View  12/29/2013   CLINICAL DATA:  Check endotracheal tube  EXAM: PORTABLE CHEST - 1 VIEW  COMPARISON:  12/27/2013  FINDINGS: Endotracheal tube has been removed previously. Lungs are well aerated and clear. No infiltrate effusion or edema.  IMPRESSION: No active disease.   Electronically Signed   By: Marlan Palauharles  Clark M.D.  On: 12/29/2013 07:50     CXR: No acute cardiopulmonary process  ASSESSMENT / PLAN:  PULMONARY A: Acute respiratory failure 2nd to asthma Status Asthmaticus Productive cough (green/yellow sputum) P:   Ct brovana/budesonide  Cont prn SABA Solumedrol 60 12h - Ct azithro x 5ds total  CARDIOVASCULAR A:  Chest pain Bilateral lower extremity edema ? amlodipin induced, BNP low, nml Lv fn H/o HTN P:  Continue home dose amlodipine Lasix 40 , start HCTZ  if BP remains high  GASTROINTESTINAL: A: GI ppx  P: PPI Adv diet   HEMATOLOGIC A:   VTE ppx  P:  Lovenox    Summary - h/o mild persistent symptoms, trigger seems to be weather/location change with perhaps exposure to cat dander, improving  Pedal edema related to amlodipin Needs OV on discharge    Cyril Mourningakesh Alva MD. FCCP. [facility] Pager [phone] If no response call [phone]

## 2013-12-30 NOTE — Discharge Summary (Signed)
Physician Discharge Summary       Patient ID: Francisco Ramos MRN: [id number] DOB/AGE: 46/03/1966 46 y.o.  Admit date: 12/27/2013 Discharge date: 12/31/2013  Discharge Diagnoses:  Acute respiratory failure secondary to asthma Status asthmaticus Chest pain Lower extremity edema Hypertension        Detailed Hospital Course:  48 year old male with history of athsma and HTN presented to [facility] in acute respiratory distress. Recent move from [place] to [place]. Reports that he was staying in a new place over the last week. He described this place as "unsanitary" to ED staff. During that time he began with a non-productive cough and intermittent asthma exacerbations. He uses flovent daily and PRN albuterol for flares. States he has been well controlled for the past 15 years but this past week he used his albuterol frequently. On 6/22 he developed increased SOB accompanied by bilateral chest pain. Also noted some pedal edema. In ED was given albuterol nebulizer and placed on BiPAP with relief. He was admitted by PCCM on 6/22 for asthmatic exacerbation, was treated with Azithromycin, Solumedrol and Brovana/Budesonide and PRN SABA with improvement. 6/23 patient was weaned off Bipap and tolerated [place]. 6/24 Patient's oxygenation continued to improve but was having intermittent hypertension and c/o dyspnea on exertion. 6/25 Hypertension improved, noted some lower extremity edema related to amlodipine and continues to have dyspnea on exertion. He has continued to make improvements to the point that he is medically ready for discharge as of 6/26. His walking oximetrey at time of discharge is 95%. He will be discharged home with the following plan outlined below.      Discharge Plan by Diagnoses    Asthma status post acute exacerbation  Plan:  Home on symbicort with rescue SABA Continue singulair, flonase Complete 5 days of Azithromycin (last dose 6/27) Prednisone taper F/u pulm clinic 7-10 days. Follow-up  date: 7/1 at 130 pm w/ Dr Vassie LollAlva   Hypertension   Plan: Continue Amlodipine and Hydrochlorothiazide   Chronic Pain   Plan: Continue prior oxycodone dosing (10 mg TID) & neurontin     Significant Hospital tests/ studies/ interventions and procedures   2D Echo 6/23: Left ventricle: The cavity size was normal. Wall thickness was normal. Systolic function was normal. The estimated ejection fraction was in the range of 60% to 65%  Discharge Exam:  General: No acute distress, no accessory muscles  Neuro: Alert, oriented x 4. No focal deficits  HEENT: [place]/AT, PERRL, No JVD noted  Cardiovascular: regular  Lungs: resp's even/non-labored, lungs bilaterally with reduced wheezing  Abdomen: Soft, non-tender  Musculoskeletal: No acute deformity or ROM limitation  Skin: BLE 1+ pitting edema, improved  BP 136/93  Pulse 107  Temp(Src) 97.4 F (36.3 C) (Oral)  Resp 20  Ht 5\' 9"  (1.753 m)  Wt 217 lb 9.5 oz (98.7 kg)  BMI 32.12 kg/m2  SpO2 95%   Labs at discharge Lab Results  Component Value Date   CREATININE 0.92 12/30/2013   BUN 26* 12/30/2013   NA 142 12/30/2013   K 4.4 12/30/2013   CL 104 12/30/2013   CO2 26 12/30/2013   Lab Results  Component Value Date   WBC 23.2* 12/30/2013   HGB 13.2 12/30/2013   HCT 39.5 12/30/2013   MCV 92.5 12/30/2013   PLT 309 12/30/2013   Lab Results  Component Value Date   ALT 32 12/29/2013   AST 22 12/29/2013   ALKPHOS 57 12/29/2013   BILITOT <0.2* 12/29/2013   No results found  for this basename: INR,  PROTIME    Current radiology studies No results found.  Disposition:  Final discharge disposition not confirmed      Discharge Instructions   Call MD for:  difficulty breathing, headache or visual disturbances    Complete by:  As directed      Call MD for:  extreme fatigue    Complete by:  As directed      Call MD for:  hives    Complete by:  As directed      Call MD for:  persistant dizziness or light-headedness    Complete by:  As directed        Call MD for:  persistant nausea and vomiting    Complete by:  As directed      Call MD for:  redness, tenderness, or signs of infection (pain, swelling, redness, odor or green/yellow discharge around incision site)    Complete by:  As directed      Call MD for:  severe uncontrolled pain    Complete by:  As directed      Call MD for:  temperature >100.4    Complete by:  As directed      Diet - low sodium heart healthy    Complete by:  As directed      Discharge instructions    Complete by:  As directed   Follow up with Pulmonary Office as scheduled Stop taking Flovent Review your medications carefully as they have changed     Increase activity slowly    Complete by:  As directed             Medication List    STOP taking these medications       fluticasone 110 MCG/ACT inhaler  Commonly known as:  FLOVENT HFA      TAKE these medications       albuterol (2.5 MG/3ML) 0.083% nebulizer solution  Commonly known as:  PROVENTIL  Take 2.5 mg by nebulization every 6 (six) hours as needed for wheezing or shortness of breath.     albuterol 108 (90 BASE) MCG/ACT inhaler  Commonly known as:  PROVENTIL HFA;VENTOLIN HFA  Inhale 2 puffs into the lungs every 6 (six) hours as needed for wheezing or shortness of breath.     amLODipine 10 MG tablet  Commonly known as:  NORVASC  Take 10 mg by mouth daily.     azithromycin 500 MG tablet  Commonly known as:  ZITHROMAX  Take 1 tablet (500 mg total) by mouth daily.     budesonide-formoterol 160-4.5 MCG/ACT inhaler  Commonly known as:  SYMBICORT  Inhale 2 puffs into the lungs 2 (two) times daily.     cyclobenzaprine 10 MG tablet  Commonly known as:  FLEXERIL  Take 10 mg by mouth 2 (two) times daily.     fluticasone 50 MCG/ACT nasal spray  Commonly known as:  FLONASE  Place 1 spray into both nostrils daily.     gabapentin 300 MG capsule  Commonly known as:  NEURONTIN  Take 300 mg by mouth 3 (three) times daily.      montelukast 10 MG tablet  Commonly known as:  SINGULAIR  Take 10 mg by mouth at bedtime.     oxyCODONE 5 MG immediate release tablet  Commonly known as:  Oxy IR/ROXICODONE  Take 10 mg by mouth 3 (three) times daily.     predniSONE 20 MG tablet  Commonly known as:  DELTASONE  Take 2 tablets (  40 mg total) by mouth daily with breakfast. 2 tabs daily for 4 days, then 1.5 tabs daily for 4 days, then 1 tab daily for 4 days, then 1/2 tab daily for 4 days and stop  Start taking on:  01/01/2014       Follow-up Information   Follow up with Oretha MilchALVA,RAKESH V., MD On 01/05/2014. (130 pm )    Specialty:  Pulmonary Disease   Contact information:   [address] [place] [address] [phone]       Discharged Condition: good  Canary BrimBrandi Ollis, NP-C [facility] Pgr: [phone] or [phone]  Physician Statement:   The Patient was personally examined, the discharge assessment and plan has been personally reviewed and I agree with NP assessment and plan. > 30 minutes of time have been dedicated to discharge assessment, planning and discharge instructions.   Cyril Mourningakesh Alva MD. Tonny BollmanFCCP. [facility] Pager [phone] If no response call [phone]     12/31/2013, 2:16 PM

## 2013-12-31 DIAGNOSIS — J9600 Acute respiratory failure, unspecified whether with hypoxia or hypercapnia: Secondary | ICD-10-CM | POA: Diagnosis not present

## 2013-12-31 DIAGNOSIS — J45902 Unspecified asthma with status asthmaticus: Secondary | ICD-10-CM | POA: Diagnosis not present

## 2013-12-31 MED ORDER — BUDESONIDE-FORMOTEROL FUMARATE 160-4.5 MCG/ACT IN AERO: 2.0000 | INHALATION_SPRAY | Freq: Two times a day (BID) | RESPIRATORY_TRACT | Status: AC

## 2013-12-31 MED ORDER — PREDNISONE 20 MG PO TABS
40.0000 mg | ORAL_TABLET | Freq: Every day | ORAL | Status: DC
  Filled 2013-12-31: qty 2

## 2013-12-31 MED ORDER — AZITHROMYCIN 500 MG PO TABS: 500.0000 mg | ORAL_TABLET | Freq: Every day | ORAL | Status: DC

## 2013-12-31 MED ORDER — PREDNISONE 20 MG PO TABS: 40.0000 mg | ORAL_TABLET | Freq: Every day | ORAL | Status: DC

## 2013-12-31 NOTE — Progress Notes (Signed)
Improved, Off O2 , ambulating Decreased wheezing scattered Decreased edema  Recommend - dc today on 40 mg prednisone -slow taper by 10 mg every  Days Needs OV next week with TP or me Ct amlodipin - if pedal edema recurs -may have to switch Prefer Symbicort 160 2 puffs bid instead of flovent Ct singulair, albuterol prn   Cyril Mourningakesh Alva MD. FCCP. [facility] Pager [phone] If no response call [phone]

## 2013-12-31 NOTE — Progress Notes (Signed)
12/31/13 1420  Reviewed discharge instructions with patient. Patient verbalized understanding of discharge instructions.  Copy of discharge instructions and prescriptions given to patient.

## 2013-12-31 NOTE — Care Management Note (Signed)
    Page 1 of 1   12/31/2013     12:14:31 PM CARE MANAGEMENT NOTE 12/31/2013  Patient:  Francisco Ramos,Francisco Ramos   Account Number:  [id number]  Date Initiated:  12/28/2013  Documentation initiated by:  DAVIS,RHONDA  Subjective/Objective Assessment:   resp distress requiring bipap/transitioned to nrb and then [name] fi02 at 100%/poss allergic reaction     Action/Plan:   home when stable   Anticipated DC Date:  12/31/2013   Anticipated DC Plan:  HOME/SELF CARE      DC Planning Services  CM consult      Choice offered to / List presented to:             Status of service:  Completed, signed off Medicare Important Message given?  YES (If response is "NO", the following Medicare IM given date fields will be blank) Date Medicare IM given:  12/31/2013 Date Additional Medicare IM given:    Discharge Disposition:  HOME/SELF CARE  Per UR Regulation:  Reviewed for med. necessity/level of care/duration of stay  If discussed at Long Length of Stay Meetings, dates discussed:    Comments:  12/31/13 Francisco HuxleyUTH Alexx Mcburney RN BSN [phone] No d/c needs identified.

## 2013-12-31 NOTE — Progress Notes (Signed)
PULMONARY / CRITICAL CARE MEDICINE   Name: Francisco Ramos MRN: [id number] DOB: 01/05/1966    ADMISSION DATE:  12/27/2013 CONSULTATION DATE:  12/27/2013  REFERRING MD :  EDP PRIMARY SERVICE: PCCM  CHIEF COMPLAINT:  Dyspnea  BRIEF PATIENT DESCRIPTION: 48 year old male with PMH significant for asthma presented to [facility] in acute respiratory distress. In ED he was given nebulized BD's and was started on BiPAP. PCCM asked to see.   SIGNIFICANT EVENTS / STUDIES:  6/22 Admitted on BiPAP  LINES / TUBES: PIV  CULTURES: n/a  ANTIBIOTICS: azithro 6/23>>>   SUBJECTIVE:  Improving daily, but remains sob on moving around C/o chronic  pain around neck, RUE Afebrile BP better  VITAL SIGNS: Temp:  [97.2 F (36.2 C)-98.3 F (36.8 C)] 98.2 F (36.8 C) (06/26 0700) Pulse Rate:  [97-111] 111 (06/26 0955) Resp:  [20-22] 20 (06/26 0955) BP: (137-158)/(80-100) 139/92 mmHg (06/26 0955) SpO2:  [93 %-95 %] 94 % (06/26 0955) Weight:  [98.7 kg (217 lb 9.5 oz)] 98.7 kg (217 lb 9.5 oz) (06/26 0700) HEMODYNAMICS:   VENTILATOR SETTINGS:   INTAKE / OUTPUT: Intake/Output     06/25 0701 - 06/26 0700 06/26 0701 - 06/27 0700   P.O.  240   Total Intake(mL/kg)  240 (2.4)   Net   +240        Urine Occurrence 5 x 2 x     PHYSICAL EXAMINATION: General:  No acute distress, no accessory muscles  Neuro:  Alert, oriented x 4. No focal deficits HEENT:  [employer]/AT, PERRL, No JVD noted Cardiovascular:   regular Lungs:  Scattered rhonchi, expiratory wheeze Abdomen:  Soft, non-tender Musculoskeletal:  No acute deformity or ROM limitation Skin:  BLE 1+ pitting edema  LABS:  CBC  Recent Labs Lab 12/28/13 0350 12/29/13 0326 12/30/13 0524  WBC 10.0 27.1* 23.2*  HGB 14.4 14.3 13.2  HCT 41.5 41.8 39.5  PLT 328 332 309   Coag's No results found for this basename: APTT, INR,  in the last 168 hours BMET  Recent Labs Lab 12/28/13 0350 12/29/13 0326 12/30/13 0524  NA 139 140 142  K 4.0 4.3  4.4  CL 100 100 104  CO2 23 24 26   BUN 14 23 26*  CREATININE 0.87 1.00 0.92  GLUCOSE 173* 136* 127*   Electrolytes  Recent Labs Lab 12/28/13 0350 12/29/13 0326 12/30/13 0524  CALCIUM 8.9 9.3 8.9   Sepsis Markers No results found for this basename: LATICACIDVEN, PROCALCITON, O2SATVEN,  in the last 168 hours ABG  Recent Labs Lab 12/27/13 2001  PHART 7.345*  PCO2ART 49.1*  PO2ART 68.5*   Liver Enzymes  Recent Labs Lab 12/29/13 0326  AST 22  ALT 32  ALKPHOS 57  BILITOT <0.2*  ALBUMIN 3.9   Cardiac Enzymes  Recent Labs Lab 12/27/13 1943 12/27/13 2306 12/28/13 0350  TROPONINI <0.30 <0.30 <0.30  PROBNP 34.6  --   --    Glucose No results found for this basename: GLUCAP,  in the last 168 hours  Imaging No results found.   CXR: No acute cardiopulmonary process  ASSESSMENT / PLAN:  PULMONARY A: Acute respiratory failure 2nd to asthma Status Asthmaticus Productive cough (green/yellow sputum) P:   Ct brovana/budesonide  Cont prn SABA Solumedrol 60 12h - Ct azithro x 5ds total  CARDIOVASCULAR A:  Chest pain Bilateral lower extremity edema ? amlodipin induced, BNP low, nml Lv fn H/o HTN P:  Continue home dose amlodipine Lasix 40 , start HCTZ  if  BP remains high  GASTROINTESTINAL: A: GI ppx  P: PPI Adv diet   HEMATOLOGIC A:   VTE ppx  P:  Lovenox    Summary - h/o mild persistent symptoms, trigger seems to be weather/location change with perhaps exposure to cat dander, improving  Pedal edema related to amlodipin Needs OV on discharge   Cyril Mourningakesh Alva MD. FCCP. [facility] Pager [phone] If no response call [phone]

## 2014-01-05 ENCOUNTER — Encounter: Payer: Self-pay | Admitting: Pulmonary Disease

## 2014-01-05 ENCOUNTER — Ambulatory Visit (INDEPENDENT_AMBULATORY_CARE_PROVIDER_SITE_OTHER): Payer: Medicare Other | Admitting: Pulmonary Disease

## 2014-01-05 VITALS — BP 124/80 | HR 112 | Temp 98.0°F | Ht 70.5 in | Wt 211.2 lb

## 2014-01-05 DIAGNOSIS — J45901 Unspecified asthma with (acute) exacerbation: Secondary | ICD-10-CM

## 2014-01-05 DIAGNOSIS — R600 Localized edema: Secondary | ICD-10-CM | POA: Insufficient documentation

## 2014-01-05 DIAGNOSIS — R609 Edema, unspecified: Secondary | ICD-10-CM | POA: Diagnosis not present

## 2014-01-05 DIAGNOSIS — J4541 Moderate persistent asthma with (acute) exacerbation: Secondary | ICD-10-CM

## 2014-01-05 NOTE — Progress Notes (Signed)
   Subjective:    Patient ID: Francisco Ramos, male    DOB: 07/28/1965, 48 y.o.   MRN: [id number]  HPI 48 year old male with history of athsma and HTN presented to [facility] in acute respiratory distress. Recent move from [place] to [place]. Reports that he was staying in a new place over the last week. He described this place as "unsanitary" to ED staff. During that time he began with a non-productive cough  He uses flovent daily and PRN albuterol for flares. States he has been well controlled for the past 15 years but this past week he used his albuterol frequently. He was admitted 6/22 -6/26 for asthmatic exacerbation, was treated with Azithromycin, Solumedrol and Brovana/Budesonide and PRN SABA with improvement.  6/23 patient was weaned off Bipap and tolerated [place]. 6/24 Patient's oxygenation continued to improve but was having intermittent hypertension and c/o dyspnea on exertion.  6/25 Hypertension improved, noted some lower extremity edema related to amlodipine  Dicharged Home on symbicort with rescue SABA  Continue singulair, flonase  Completed 5 days of Azithromycin (last dose 6/27)  Prednisone dropped to 30 mg Feels 70% better but not back to baseline yet  Significant tests/ events  2D Echo 6/23: Left ventricle: The cavity size was normal. Wall thickness was normal. Systolic function was normal. The estimated ejection fraction was in the range of 60% to 65%   Review of Systems neg for any significant sore throat, dysphagia, itching, sneezing, nasal congestion or excess/ purulent secretions, fever, chills, sweats, unintended wt loss, pleuritic or exertional cp, hempoptysis, orthopnea pnd or change in chronic leg swelling. Also denies presyncope, palpitations, heartburn, abdominal pain, nausea, vomiting, diarrhea or change in bowel or urinary habits, dysuria,hematuria, rash, arthralgias, visual complaints, headache, numbness weakness or ataxia.     Objective:   Physical Exam  Gen. Pleasant,  well-nourished, in no distress ENT - no lesions, no post nasal drip Neck: No JVD, no thyromegaly, no carotid bruits Lungs: no use of accessory muscles, no dullness to percussion, clear without rales or rhonchi  Cardiovascular: Rhythm regular, heart sounds  normal, no murmurs or gallops, no peripheral edema Musculoskeletal: No deformities, no cyanosis or clubbing         Assessment & Plan:

## 2014-01-05 NOTE — Assessment & Plan Note (Signed)
Prednisone taper as outlined Stay on symbicort 2 puffs twice daily  Stay on singulair & flonase RAST testing & spirometry in the future once off prednisone

## 2014-01-05 NOTE — Patient Instructions (Signed)
Prednisone taper as outlined Stay on symbicort 2 puffs twice daily  Stay on singulair & flonase

## 2014-01-10 DIAGNOSIS — M4800 Spinal stenosis, site unspecified: Secondary | ICD-10-CM | POA: Diagnosis not present

## 2014-01-10 DIAGNOSIS — J45909 Unspecified asthma, uncomplicated: Secondary | ICD-10-CM | POA: Diagnosis not present

## 2014-01-10 DIAGNOSIS — M545 Low back pain, unspecified: Secondary | ICD-10-CM | POA: Diagnosis not present

## 2014-01-10 DIAGNOSIS — G4700 Insomnia, unspecified: Secondary | ICD-10-CM | POA: Diagnosis not present

## 2014-01-24 DIAGNOSIS — J45909 Unspecified asthma, uncomplicated: Secondary | ICD-10-CM | POA: Diagnosis not present

## 2014-01-24 DIAGNOSIS — G4700 Insomnia, unspecified: Secondary | ICD-10-CM | POA: Diagnosis not present

## 2014-01-24 DIAGNOSIS — M545 Low back pain, unspecified: Secondary | ICD-10-CM | POA: Diagnosis not present

## 2014-01-24 DIAGNOSIS — I10 Essential (primary) hypertension: Secondary | ICD-10-CM | POA: Diagnosis not present

## 2014-01-31 ENCOUNTER — Ambulatory Visit (INDEPENDENT_AMBULATORY_CARE_PROVIDER_SITE_OTHER): Payer: Medicare Other | Admitting: Adult Health

## 2014-01-31 ENCOUNTER — Other Ambulatory Visit: Payer: Medicare Other

## 2014-01-31 ENCOUNTER — Encounter: Payer: Self-pay | Admitting: Adult Health

## 2014-01-31 VITALS — BP 128/88 | HR 110 | Temp 98.0°F | Ht 71.0 in | Wt 208.0 lb

## 2014-01-31 DIAGNOSIS — J45901 Unspecified asthma with (acute) exacerbation: Secondary | ICD-10-CM

## 2014-01-31 DIAGNOSIS — J4541 Moderate persistent asthma with (acute) exacerbation: Secondary | ICD-10-CM

## 2014-01-31 NOTE — Progress Notes (Signed)
   Subjective:    Patient ID: Francisco Ramos, male    DOB: 12/21/1965, 48 y.o.   MRN: [id number]  HPI  48 year old male with history of athsma and HTN presented to [facility] in acute respiratory distress. Recent move from [place] to [place]. Reports that he was staying in a new place over the last week. He described this place as "unsanitary" to ED staff. During that time he began with a non-productive cough  He uses flovent daily and PRN albuterol for flares. States he has been well controlled for the past 15 years but this past week he used his albuterol frequently. He was admitted 6/22 -6/26 for asthmatic exacerbation, was treated with Azithromycin, Solumedrol and Brovana/Budesonide and PRN SABA with improvement.  6/23 patient was weaned off Bipap and tolerated [place]. 6/24 Patient's oxygenation continued to improve but was having intermittent hypertension and c/o dyspnea on exertion.  6/25 Hypertension improved, noted some lower extremity edema related to amlodipine  Dicharged Home on symbicort with rescue SABA  Continue singulair, flonase  Completed 5 days of Azithromycin (last dose 6/27)  Prednisone dropped to 30 mg Feels 70% better but not back to baseline yet  Significant tests/ events  2D Echo 6/23: Left ventricle: The cavity size was normal. Wall thickness was normal. Systolic function was normal. The estimated ejection fraction was in the range of 60% to 65%   01/31/2014 Follow up Asthma  Pt returns for 3 week follow up for recent slow to resolved asthma exacerbation requiring hospitalization last month.  He was having slow recovery with persistent wheezing and given pred taper at last ov.  Pt returns today feeling much better w/ resolution of wheezing.  Remains on Symbicort and singulair .  Currently moving from [place].  Denies chest pain, orthopnea , hemoptysis , edema or fever.  Has nasal congestion , flonase helps .    Review of Systems  neg for any significant sore throat, dysphagia,  itching, sneezing, nasal congestion or excess/ purulent secretions, fever, chills, sweats, unintended wt loss, pleuritic or exertional cp, hempoptysis, orthopnea pnd or change in chronic leg swelling. Also denies presyncope, palpitations, heartburn, abdominal pain, nausea, vomiting, diarrhea or change in bowel or urinary habits, dysuria,hematuria, rash, arthralgias, visual complaints, headache, numbness weakness or ataxia.     Objective:   Physical Exam   Gen. Pleasant, well-nourished, in no distress ENT - no lesions, no post nasal drip Neck: No JVD, no thyromegaly, no carotid bruits Lungs: no use of accessory muscles, no dullness to percussion, clear without rales or rhonchi  Cardiovascular: Rhythm regular, heart sounds  normal, no murmurs or gallops, no peripheral edema Musculoskeletal: No deformities, no cyanosis or clubbing         Assessment & Plan:

## 2014-01-31 NOTE — Patient Instructions (Signed)
Continue on Symbicort 2 puffs twice daily , brush, rinse and gargle after use.  Continue on Singulair At bedtime   Labs today .  Follow up Dr. Vassie LollAlva  In 6 weeks with PFT and As needed   Please contact office for sooner follow up if symptoms do not improve or worsen or seek emergency care

## 2014-02-01 LAB — ALLERGY FULL PROFILE
ALLERGEN, D PTERNOYSSINUS, D1: 9.86 kU/L — AB
Bahia Grass: 9.39 kU/L — ABNORMAL HIGH
Bermuda Grass: 1.89 kU/L — ABNORMAL HIGH
Box Elder IgE: 0.22 kU/L — ABNORMAL HIGH
CAT DANDER: 1.96 kU/L — AB
Candida Albicans: <0.1 kU/L
Common Ragweed: 0.91 kU/L — ABNORMAL HIGH
Curvularia lunata: <0.1 kU/L
D. FARINAE: 17.8 kU/L — AB
Dog Dander: 1.24 kU/L — ABNORMAL HIGH
ELM IGE: 1.29 kU/L — AB
Fescue: 18.5 kU/L — ABNORMAL HIGH
G005 Rye, Perennial: 18.9 kU/L — ABNORMAL HIGH
G009 Red Top: 18.8 kU/L — ABNORMAL HIGH
GOOSE FEATHERS: 0.19 kU/L — AB
Goldenrod: 1.11 kU/L — ABNORMAL HIGH
HOUSE DUST HOLLISTER: 1.44 kU/L — AB
Helminthosporium halodes: <0.1 kU/L
IgE (Immunoglobulin E), Serum: 439.1 IU/mL — ABNORMAL HIGH (ref 0.0–180.0)
LAMB'S QUARTERS CLASS: 0.46 kU/L — AB
OAK CLASS: 0.22 kU/L — AB
PLANTAIN: 0.19 kU/L — AB
Sycamore Tree: 0.29 kU/L — ABNORMAL HIGH
TIMOTHY GRASS: 15.7 kU/L — AB

## 2014-02-02 ENCOUNTER — Encounter: Payer: Self-pay | Admitting: Adult Health

## 2014-02-03 ENCOUNTER — Telehealth: Payer: Self-pay | Admitting: Adult Health

## 2014-02-03 NOTE — Progress Notes (Signed)
Quick Note:  Pt aware per 7.30.15 phone note. ______

## 2014-02-03 NOTE — Telephone Encounter (Signed)
Notes Recorded by Julio Sicksammy S Parrett, NP on 02/02/2014 at 12:29 PM Allergy profile is very high/posiitve to multiple triggers esp grass/tress, dag/cat  IGE marker is high  Would refer to allergy Dr. Maple HudsonYoung To evaluate.   LMTCB

## 2014-02-03 NOTE — Progress Notes (Signed)
Quick Note:  LMOM TCB x1. ______ 

## 2014-02-03 NOTE — Telephone Encounter (Signed)
Spoke with the pt and notified of results per TP  Pt verbalized understanding  Allergy consult scheduled for 03/03/14

## 2014-02-03 NOTE — Telephone Encounter (Signed)
Pt returned call, please call back at [phone]

## 2014-02-04 NOTE — Assessment & Plan Note (Signed)
Recent exacerbation now resolved  Check RAST /IGE today   Plan  Continue on Symbicort 2 puffs twice daily , brush, rinse and gargle after use.  Continue on Singulair At bedtime   Labs today .  Follow up Dr. Vassie LollAlva  In 6 weeks with PFT and As needed   Please contact office for sooner follow up if symptoms do not improve or worsen or seek emergency care

## 2014-02-14 DIAGNOSIS — I10 Essential (primary) hypertension: Secondary | ICD-10-CM | POA: Diagnosis not present

## 2014-02-14 DIAGNOSIS — J45909 Unspecified asthma, uncomplicated: Secondary | ICD-10-CM | POA: Diagnosis not present

## 2014-02-14 DIAGNOSIS — G4700 Insomnia, unspecified: Secondary | ICD-10-CM | POA: Diagnosis not present

## 2014-02-14 DIAGNOSIS — M545 Low back pain, unspecified: Secondary | ICD-10-CM | POA: Diagnosis not present

## 2014-02-14 DIAGNOSIS — M4800 Spinal stenosis, site unspecified: Secondary | ICD-10-CM | POA: Diagnosis not present

## 2014-03-03 ENCOUNTER — Institutional Professional Consult (permissible substitution): Payer: Medicare Other | Admitting: Internal Medicine

## 2014-03-21 DIAGNOSIS — M549 Dorsalgia, unspecified: Secondary | ICD-10-CM | POA: Diagnosis not present

## 2014-03-21 DIAGNOSIS — R5381 Other malaise: Secondary | ICD-10-CM | POA: Diagnosis not present

## 2014-03-21 DIAGNOSIS — G4700 Insomnia, unspecified: Secondary | ICD-10-CM | POA: Diagnosis not present

## 2014-03-21 DIAGNOSIS — J45909 Unspecified asthma, uncomplicated: Secondary | ICD-10-CM | POA: Diagnosis not present

## 2014-03-21 DIAGNOSIS — I10 Essential (primary) hypertension: Secondary | ICD-10-CM | POA: Diagnosis not present

## 2014-03-21 DIAGNOSIS — R5383 Other fatigue: Secondary | ICD-10-CM | POA: Diagnosis not present

## 2014-03-22 ENCOUNTER — Ambulatory Visit: Payer: Medicare Other | Admitting: Pulmonary Disease

## 2014-04-22 DIAGNOSIS — J309 Allergic rhinitis, unspecified: Secondary | ICD-10-CM | POA: Diagnosis not present

## 2014-04-22 DIAGNOSIS — I10 Essential (primary) hypertension: Secondary | ICD-10-CM | POA: Diagnosis not present

## 2014-04-22 DIAGNOSIS — Z6841 Body Mass Index (BMI) 40.0 and over, adult: Secondary | ICD-10-CM | POA: Diagnosis not present

## 2014-04-22 DIAGNOSIS — Z23 Encounter for immunization: Secondary | ICD-10-CM | POA: Diagnosis not present

## 2014-04-22 DIAGNOSIS — J45909 Unspecified asthma, uncomplicated: Secondary | ICD-10-CM | POA: Diagnosis not present

## 2014-04-22 DIAGNOSIS — M549 Dorsalgia, unspecified: Secondary | ICD-10-CM | POA: Diagnosis not present

## 2014-05-03 ENCOUNTER — Encounter: Payer: Self-pay | Admitting: Family Medicine

## 2014-05-03 ENCOUNTER — Ambulatory Visit (INDEPENDENT_AMBULATORY_CARE_PROVIDER_SITE_OTHER): Payer: Medicare Other | Admitting: Family Medicine

## 2014-05-03 VITALS — BP 142/98 | HR 108 | Temp 98.3°F | Ht 69.5 in | Wt 204.0 lb

## 2014-05-03 DIAGNOSIS — J4541 Moderate persistent asthma with (acute) exacerbation: Secondary | ICD-10-CM

## 2014-05-03 DIAGNOSIS — Z0000 Encounter for general adult medical examination without abnormal findings: Secondary | ICD-10-CM

## 2014-05-03 DIAGNOSIS — Z760 Encounter for issue of repeat prescription: Secondary | ICD-10-CM

## 2014-05-03 MED ORDER — MONTELUKAST SODIUM 10 MG PO TABS: 10.0000 mg | ORAL_TABLET | Freq: Every day | ORAL | Status: AC

## 2014-05-03 MED ORDER — HYDROCHLOROTHIAZIDE 12.5 MG PO TABS: 12.5000 mg | ORAL_TABLET | Freq: Every day | ORAL | Status: DC

## 2014-05-03 MED ORDER — FLUTICASONE PROPIONATE 50 MCG/ACT NA SUSP: 1.0000 | Freq: Every day | NASAL | Status: AC

## 2014-05-03 MED ORDER — TRAZODONE HCL 50 MG PO TABS: 50.0000 mg | ORAL_TABLET | Freq: Every evening | ORAL | Status: DC | PRN

## 2014-05-03 NOTE — Patient Instructions (Signed)
Thank you so much for coming to visit me today! I have refilled most of your medications.  Please try to sign the form for us to get the forms from your doctor in [place].  After we receive these forms, please schedule an appointment so we can further discuss and manage your pain!  Thanks again! Dr. Caroleen Hammanumley

## 2014-05-04 DIAGNOSIS — Z7689 Persons encountering health services in other specified circumstances: Secondary | ICD-10-CM | POA: Insufficient documentation

## 2014-05-04 DIAGNOSIS — Z760 Encounter for issue of repeat prescription: Secondary | ICD-10-CM | POA: Insufficient documentation

## 2014-05-04 NOTE — Assessment & Plan Note (Signed)
-   Refill of HCTZ, Trazodone, Montelukast, and Flonase sent in - Discussed that pain medications will need to be discussed at a different appointment - [facility]. - Agreed to schedule appointment to discuss pain

## 2014-05-04 NOTE — Progress Notes (Signed)
Subjective:     Patient ID: Francisco Ramos, male   DOB: 01/22/1966, 48 y.o.   MRN: [id number]  HPI Francisco Ramos is a 48yo male presenting today to establish care as a new patient. - No acute complaints today except for his chronic pain.  He still has his pain medications, however he does not have many left. He states he knows that he cannot obtain narcotics at his first appointment and states he plans to come back to further discuss his pain. - Reports decreased interest and pleasure in doing things  PMH: Arthritis (neck, back, knees), Asthma (last flare in July 2015), Chronic Pain (located in neck radiating into right arm and lower back secondary to three herniated discs radiating into left leg), HTN, Insomnia, and Eczema  - Echocardiogram on heart in 2015 was normal Meds: Symbicort, Pro-Air, HCTZ, Oxycodone, Trazodone, Montelukast, Flonase, Centrum Multivitamins, D2-2000 IU Vitamin D, and Hydrocortisone 2.5% Ax: [employer] Sx: Cervical Fusion 2013, Hip Bone Graft 2012 Fam Hx: HTN (Mother, Sister), Heart Disease (Father, Sister), Diabetes (Father) Soc Hx: lives with family; does not regularly exercise secondary to pain; disabled; male sexual partners; denies history of smoking, recreational drug use, or alcohol use  Review of Systems  Constitutional: Negative for fever.  Respiratory: Negative for shortness of breath.   Cardiovascular: Negative for chest pain.  Musculoskeletal: Positive for arthralgias, back pain, neck pain and neck stiffness.       Objective:   Physical Exam  Constitutional: He is oriented to person, place, and time. He appears well-developed and well-nourished. No distress.  HENT:  Head: Normocephalic and atraumatic.  Mouth/Throat: Oropharynx is clear and moist.  Eyes: Pupils are equal, round, and reactive to light. Right eye exhibits no discharge. Left eye exhibits no discharge. No scleral icterus.  Neck:  Decreased ROM in all planes  Cardiovascular: Normal rate,  regular rhythm and normal heart sounds.  Exam reveals no gallop and no friction rub.   No murmur heard. Pulmonary/Chest: Breath sounds normal. No respiratory distress. He has no wheezes. He has no rales.  Abdominal: Soft. Bowel sounds are normal. He exhibits no distension. There is no tenderness.  Musculoskeletal: He exhibits tenderness.  Neurological: He is alert and oriented to person, place, and time.  Note: Positive findings in exam suspected to be due to non-organic cause. Decreased sensation over right side of face, decreased sensation in right arm, decreased sensation in left leg, abnormal findings of EOM (appeared to move eyes deliberately in poor mimic of diagonal strabismus), decreased strength in right arm, initially showed decreased strength in right leg but then midway through exam demonstrated decreased strength in left leg, no pain with seated straight leg raise, reflexes normal  Skin: Skin is warm. No rash noted.  Psychiatric: He has a normal mood and affect. His behavior is normal.       Assessment:     Please refer to Problem List for Assessment.    Plan:     Please refer to Problem List or Plan.

## 2014-05-04 NOTE — Assessment & Plan Note (Signed)
-   Attempt to obtain records from [facility] - Request labs from [place] in [place]. Consider labs at next appointment if none obtained recently. - Obtain records concerning possible physical therapy notes - Consider referral to pain management if appropriate

## 2014-05-19 ENCOUNTER — Institutional Professional Consult (permissible substitution): Payer: Medicare Other | Admitting: Internal Medicine

## 2014-05-20 DIAGNOSIS — M549 Dorsalgia, unspecified: Secondary | ICD-10-CM | POA: Diagnosis not present

## 2014-05-20 DIAGNOSIS — J45909 Unspecified asthma, uncomplicated: Secondary | ICD-10-CM | POA: Diagnosis not present

## 2014-05-20 DIAGNOSIS — E669 Obesity, unspecified: Secondary | ICD-10-CM | POA: Diagnosis not present

## 2014-05-20 DIAGNOSIS — Z6830 Body mass index (BMI) 30.0-30.9, adult: Secondary | ICD-10-CM | POA: Diagnosis not present

## 2014-05-20 DIAGNOSIS — J309 Allergic rhinitis, unspecified: Secondary | ICD-10-CM | POA: Diagnosis not present

## 2014-05-20 DIAGNOSIS — I10 Essential (primary) hypertension: Secondary | ICD-10-CM | POA: Diagnosis not present

## 2014-05-20 DIAGNOSIS — G4700 Insomnia, unspecified: Secondary | ICD-10-CM | POA: Diagnosis not present

## 2014-05-23 ENCOUNTER — Ambulatory Visit: Payer: Medicare Other | Admitting: Pulmonary Disease

## 2014-05-27 ENCOUNTER — Encounter: Payer: Self-pay | Admitting: Family Medicine

## 2014-05-27 ENCOUNTER — Ambulatory Visit (INDEPENDENT_AMBULATORY_CARE_PROVIDER_SITE_OTHER): Payer: Medicare Other | Admitting: Family Medicine

## 2014-05-27 VITALS — BP 130/90 | HR 89 | Temp 98.1°F | Ht 70.0 in | Wt 203.3 lb

## 2014-05-27 DIAGNOSIS — M545 Low back pain: Secondary | ICD-10-CM

## 2014-05-27 DIAGNOSIS — Z79891 Long term (current) use of opiate analgesic: Secondary | ICD-10-CM

## 2014-05-27 DIAGNOSIS — M5010 Cervical disc disorder with radiculopathy, unspecified cervical region: Secondary | ICD-10-CM

## 2014-05-27 DIAGNOSIS — M549 Dorsalgia, unspecified: Secondary | ICD-10-CM | POA: Diagnosis not present

## 2014-05-27 DIAGNOSIS — Z5181 Encounter for therapeutic drug level monitoring: Secondary | ICD-10-CM | POA: Diagnosis not present

## 2014-05-27 DIAGNOSIS — M542 Cervicalgia: Secondary | ICD-10-CM | POA: Diagnosis not present

## 2014-05-27 DIAGNOSIS — M79605 Pain in left leg: Secondary | ICD-10-CM

## 2014-05-27 DIAGNOSIS — Z79899 Other long term (current) drug therapy: Secondary | ICD-10-CM | POA: Diagnosis not present

## 2014-05-27 MED ORDER — OXYCODONE HCL 5 MG PO TABS: 5.0000 mg | ORAL_TABLET | Freq: Four times a day (QID) | ORAL | Status: DC | PRN

## 2014-05-27 MED ORDER — CYCLOBENZAPRINE HCL 10 MG PO TABS: 10.0000 mg | ORAL_TABLET | Freq: Two times a day (BID) | ORAL | Status: DC | PRN

## 2014-05-27 MED ORDER — GABAPENTIN 100 MG PO CAPS: 100.0000 mg | ORAL_CAPSULE | Freq: Three times a day (TID) | ORAL | Status: DC

## 2014-05-27 NOTE — Patient Instructions (Addendum)
Thank you so much for coming to visit us today!  I have given you prescriptions for Oxycodone, Flexeril, and Gabapentin.  We will try a lower dose of Gabapentin to see if you can tolerate it... It is the medication that is most likely to help your nerve pain!  Please only take the oxycodone when you are in severe pain not relieved by Tylenol or NSAIDs (Aleve, Motrin, Advil, etc.).   I would like to see an MRI so we can know exactly what we are dealing with! If you have records of a recent MRI of your neck and lower back, we would like to obtain those records. If not, I would like to have those pictures done.  I have given you a prescription for a shower chair.  Please schedule an appointment for 1 month so I can see how the gabapentin is working and how your pain is doing!  Thanks again and please let me know if there's anything I can do for you! Dr. Caroleen Hammanumley

## 2014-05-27 NOTE — Progress Notes (Signed)
Subjective:     Patient ID: Francisco Ramos, male   DOB: 09/18/1965, 48 y.o.   MRN: [id number]  HPI [name] is a 48yo male with history of arthritis in his knees, back, and neck and chronic pain in his neck and lower back (three herniated discs). Last appointment was to establish care and per clinic policy pain medications cannot be initiated at initial visits. Presents today for refill of pain medications.  # Chronic Neck and Back Pain: - Neck pain with numbness, tingling, and pain into right arm - History of cervical fusion in 2013 - Low back pain with numbness, tingling, and pain into left leg - Has had MRIs in past. None in records sent to clinic. Would like to have an MRI after holiday season - Was previously treated with Oxycodone 10mg  TID and Flexeril 10mg  BID. [facility] [place] [facility] - Has been out of pain medications for 13 days - States pain makes it difficult for him to do chores around the house (laundry, washing dishes), drive (difficulty looking side to side d/t pain), shopping, and showering - Would like prescription for shower chair today. Pain with standing for long periods. Had shower chair in [place] but it was left behind during the move. - Has never had surgery on lower back.  Review of Systems  Constitutional: Positive for activity change.  Genitourinary: Negative for difficulty urinating.  Musculoskeletal: Positive for back pain, gait problem and neck pain.   Social history reviewed.    Objective:   Physical Exam  Constitutional: He is oriented to person, place, and time. He appears well-developed and well-nourished. No distress.  Without walker/cane/crutches  Neck:  Decreased ROM bilaterally (~45degrees), pain with compression of neck, pain with passive rotation, pain with palpation of spinous processes  Cardiovascular: Normal rate and regular rhythm.  Exam reveals no gallop and no friction rub.   No murmur  heard. Pulmonary/Chest: Breath sounds normal. No respiratory distress. He has no wheezes. He has no rales.  Abdominal: Soft. Bowel sounds are normal. He exhibits no distension. There is no tenderness.  Musculoskeletal: He exhibits no edema.  Back brace present  Neurological: He is alert and oriented to person, place, and time.  Decreased sensation in right arm and left leg  Skin: Skin is warm and dry. No rash noted.  Psychiatric: He has a normal mood and affect. His behavior is normal.       Assessment:     Please refer to Problem List for Assessment.    Plan:     Please refer to Problem List for Plan

## 2014-05-28 LAB — DRUG SCR UR, PAIN MGMT, REFLEX CONF
Amphetamine Screen, Ur: NEGATIVE
BENZODIAZEPINES.: NEGATIVE
Barbiturate Quant, Ur: NEGATIVE
Cocaine Metabolites: NEGATIVE
Creatinine,U: 58.88 mg/dL
Marijuana Metabolite: NEGATIVE
Methadone: NEGATIVE
Opiates: NEGATIVE
PHENCYCLIDINE (PCP): NEGATIVE
Propoxyphene: NEGATIVE

## 2014-05-29 DIAGNOSIS — M549 Dorsalgia, unspecified: Secondary | ICD-10-CM | POA: Insufficient documentation

## 2014-05-29 NOTE — Assessment & Plan Note (Signed)
-   Pain contract signed - UDS- pan-negative - Will not prescribed scheduled opioid at this time.  - Given prescription for Oxycodone 5mg , #30 tablets  - Recommend treatment of pain with Tylenol and NSAIDs. If these are not effective, then consider opioid. -Given gabapentin 100mg  TID. States intolerance of Gabapentin in past with abdominal complaints. Was prescribed 300mg  TID, so will start with smaller dose an titrate up if tolerated. - Refill of Flexeril given. - Agrees to MRI, however would like this done after holidays. Will order at future visit since prior authorization will only last 30 days. - Written prescription for shower chair given. - Instructed to follow up in one month - Voiced concerns that Oxycodone dose was being decreased in quantity as well as frequency. Re-emphasized importance of trying Tylenol and NSAIDs before taking Oxycodone and that he should no longer use it as a scheduled medication. Instructed to schedule an appointment sooner than one month if pain is not well controlled, however he should attempt to make oxycodone last one month.

## 2014-06-10 ENCOUNTER — Telehealth: Payer: Self-pay | Admitting: Family Medicine

## 2014-06-10 NOTE — Telephone Encounter (Signed)
Pt called and said the new dosage of his oxycodone is not working and wanted to be put back on his previous dosage. jw

## 2014-06-13 NOTE — Telephone Encounter (Signed)
Please let him know that we will discuss this at his visit next week or he may call to schedule an earlier appointment if he wishes to be seen before then.

## 2014-06-13 NOTE — Telephone Encounter (Signed)
Called patient and left voice mail with the below response from Dr. Caroleen Hammanumley.Glennie HawkSimpson, Michelle R

## 2014-06-14 NOTE — Telephone Encounter (Signed)
Spoke with patient and moved appointment up from next week to this Friday at 2:30 with Dr. Caroleen Hammanumley. Will cancel next week appointment for patient.

## 2014-06-17 ENCOUNTER — Ambulatory Visit (INDEPENDENT_AMBULATORY_CARE_PROVIDER_SITE_OTHER): Payer: Medicare Other | Admitting: Family Medicine

## 2014-06-17 ENCOUNTER — Encounter: Payer: Self-pay | Admitting: Family Medicine

## 2014-06-17 VITALS — BP 155/97 | HR 114 | Temp 98.3°F | Ht 70.0 in | Wt 200.8 lb

## 2014-06-17 DIAGNOSIS — M549 Dorsalgia, unspecified: Secondary | ICD-10-CM

## 2014-06-17 DIAGNOSIS — M5442 Lumbago with sciatica, left side: Secondary | ICD-10-CM | POA: Diagnosis not present

## 2014-06-17 DIAGNOSIS — M542 Cervicalgia: Secondary | ICD-10-CM

## 2014-06-17 NOTE — Progress Notes (Signed)
Subjective:     Patient ID: Francisco Ramos, male   DOB: 08/19/1965, 48 y.o.   MRN: [id number]  HPI [name] is a 48yo male presenting today for follow up on his pain medications.  # Pain Management: - States he has filled his Oxycodone prescription from his visit on 11/20, which was for 30 tablets of 5mg  - States he has not had any other physicians to fill his medications since moving to [place] in the summer - States he is currently out of Oxycodone. He has been taking approximately 5 tablets a day.  - He is not happy with being switched off of his scheduled regimen to a PRN regimen. Would like to be switched back to scheduled Oxycodone - Has not been taking Tylenol or NSAIDs as instructed at last visit - Has stopped taking Gabapentin as prescribed at last visit - Currently only treating pain with Oxycodone and Flexeril - Pain in neck and back are the worst it's been in years. States he is unable to drive or do anything around the house. States it keeps him from sleeping at night. - Would like to have MRI of neck and lower back  Review of Systems  Musculoskeletal: Positive for back pain and neck pain.      Objective:   Physical Exam  Constitutional: He appears well-developed and well-nourished. No distress.  Note reports severe pain, however he is not noted to be in any distress when distracted  Neck:  Midline tenderness reported. Reports pain with Spurling's and when passively and actively moving neck through range of motion. Note that he appeared to be nodding and shaking head throughout conversation without any appearance of pain.  Cardiovascular: Normal rate and regular rhythm.  Exam reveals no gallop and no friction rub.   No murmur heard. Pulmonary/Chest: Effort normal. No respiratory distress. He has no wheezes. He has no rales.  Musculoskeletal: He exhibits no edema.  Back brace present. Positive Waddell's (negative straight leg raise when seated; severe pain when lying).    Neurological:  DTRs could not be done as patient would not relax muscles.      Assessment:     Please refer to Problem List for Assessment.    Plan:     Please refer to Problem List for Plan.

## 2014-06-17 NOTE — Patient Instructions (Signed)
Thank you so much for coming to visit with me today1  We will plan on a referral to Pain Management and MRIs of your neck and lower back. Please sign a release of information form so we can obtain records from your surgeon in [place].  Unfortunately no more pain medications can be prescribed at this time.  Thanks again! Dr. Caroleen Hammanumley

## 2014-06-18 NOTE — Assessment & Plan Note (Addendum)
-   Prescribed 30 tablets of 5mg  Oxycodone on 11/20 - Amenable to Pain Management referral  - MRI w/o contrast of cervical and lumbar spine - Consider calling physician in [place] and speaking with them directly - Sign release of information form today to obtain records from neurosurgeon - History and Physical Exam suspicious for possible malingering.  - Did not follow plan from last visit, including Tylenol and Ibuprofen as well as Gabapentin  - [place] Controlled Substance Reporting System checked at visit: - [name] states that he filled prescription given at last visit. Also states that he has not seen another physician [place] and that he has not obtained any other prescriptions since arrival to state - Records show that Dr. Nedra Ramos at [facility] in [place], [place] has given [name] Oxycodone 5mg  on multiple occasions since arrival in [place] - Obtained 90 tablets of 5mg  Oxycodone on 9/14, 10/22, and 11/13 - Prescription written on 11/20 was never filled by [name]  - Important to note that UDS was obtained at last visit and was negative for opioids despite multiple refills of this medication at the past few months. - CONTROLLED SUBSTANCES WILL NO LONGER BE PRESCRIBED FOR [name] BY THIS CLINIC. THESE WILL BE MANAGED BY PAIN MANAGEMENT CLINIC.

## 2014-06-20 DIAGNOSIS — E669 Obesity, unspecified: Secondary | ICD-10-CM | POA: Diagnosis not present

## 2014-06-20 DIAGNOSIS — J309 Allergic rhinitis, unspecified: Secondary | ICD-10-CM | POA: Diagnosis not present

## 2014-06-20 DIAGNOSIS — J45909 Unspecified asthma, uncomplicated: Secondary | ICD-10-CM | POA: Diagnosis not present

## 2014-06-20 DIAGNOSIS — G4700 Insomnia, unspecified: Secondary | ICD-10-CM | POA: Diagnosis not present

## 2014-06-20 DIAGNOSIS — M549 Dorsalgia, unspecified: Secondary | ICD-10-CM | POA: Diagnosis not present

## 2014-06-20 DIAGNOSIS — Z6830 Body mass index (BMI) 30.0-30.9, adult: Secondary | ICD-10-CM | POA: Diagnosis not present

## 2014-06-20 DIAGNOSIS — I10 Essential (primary) hypertension: Secondary | ICD-10-CM | POA: Diagnosis not present

## 2014-06-21 ENCOUNTER — Ambulatory Visit: Payer: Medicare Other | Admitting: Family Medicine

## 2014-07-12 ENCOUNTER — Ambulatory Visit (HOSPITAL_COMMUNITY): Admission: RE | Admit: 2014-07-12 | Payer: Medicare Other | Source: Ambulatory Visit

## 2014-07-12 ENCOUNTER — Ambulatory Visit (HOSPITAL_COMMUNITY): Payer: Medicare Other | Attending: Family Medicine

## 2014-07-18 DIAGNOSIS — Z6830 Body mass index (BMI) 30.0-30.9, adult: Secondary | ICD-10-CM | POA: Diagnosis not present

## 2014-07-18 DIAGNOSIS — E669 Obesity, unspecified: Secondary | ICD-10-CM | POA: Diagnosis not present

## 2014-07-18 DIAGNOSIS — J309 Allergic rhinitis, unspecified: Secondary | ICD-10-CM | POA: Diagnosis not present

## 2014-07-18 DIAGNOSIS — G4700 Insomnia, unspecified: Secondary | ICD-10-CM | POA: Diagnosis not present

## 2014-07-18 DIAGNOSIS — M549 Dorsalgia, unspecified: Secondary | ICD-10-CM | POA: Diagnosis not present

## 2014-07-18 DIAGNOSIS — J45909 Unspecified asthma, uncomplicated: Secondary | ICD-10-CM | POA: Diagnosis not present

## 2014-07-18 DIAGNOSIS — I10 Essential (primary) hypertension: Secondary | ICD-10-CM | POA: Diagnosis not present

## 2014-08-17 DIAGNOSIS — M4806 Spinal stenosis, lumbar region: Secondary | ICD-10-CM | POA: Diagnosis not present

## 2014-08-17 DIAGNOSIS — M5030 Other cervical disc degeneration, unspecified cervical region: Secondary | ICD-10-CM | POA: Diagnosis not present

## 2014-08-17 DIAGNOSIS — I10 Essential (primary) hypertension: Secondary | ICD-10-CM | POA: Diagnosis not present

## 2014-08-17 DIAGNOSIS — J45909 Unspecified asthma, uncomplicated: Secondary | ICD-10-CM | POA: Diagnosis not present

## 2014-08-17 DIAGNOSIS — E785 Hyperlipidemia, unspecified: Secondary | ICD-10-CM | POA: Diagnosis not present

## 2014-08-17 DIAGNOSIS — M4802 Spinal stenosis, cervical region: Secondary | ICD-10-CM | POA: Diagnosis not present

## 2014-08-17 DIAGNOSIS — G894 Chronic pain syndrome: Secondary | ICD-10-CM | POA: Diagnosis not present

## 2014-08-17 DIAGNOSIS — M519 Unspecified thoracic, thoracolumbar and lumbosacral intervertebral disc disorder: Secondary | ICD-10-CM | POA: Diagnosis not present

## 2014-09-17 IMAGING — CR DG CHEST 1V PORT
1 series · 1 of 1 positions shown · non-contrast
Comparison: None.

CLINICAL DATA: Respiratory distress

EXAM:
PORTABLE CHEST - 1 VIEW

[AP]
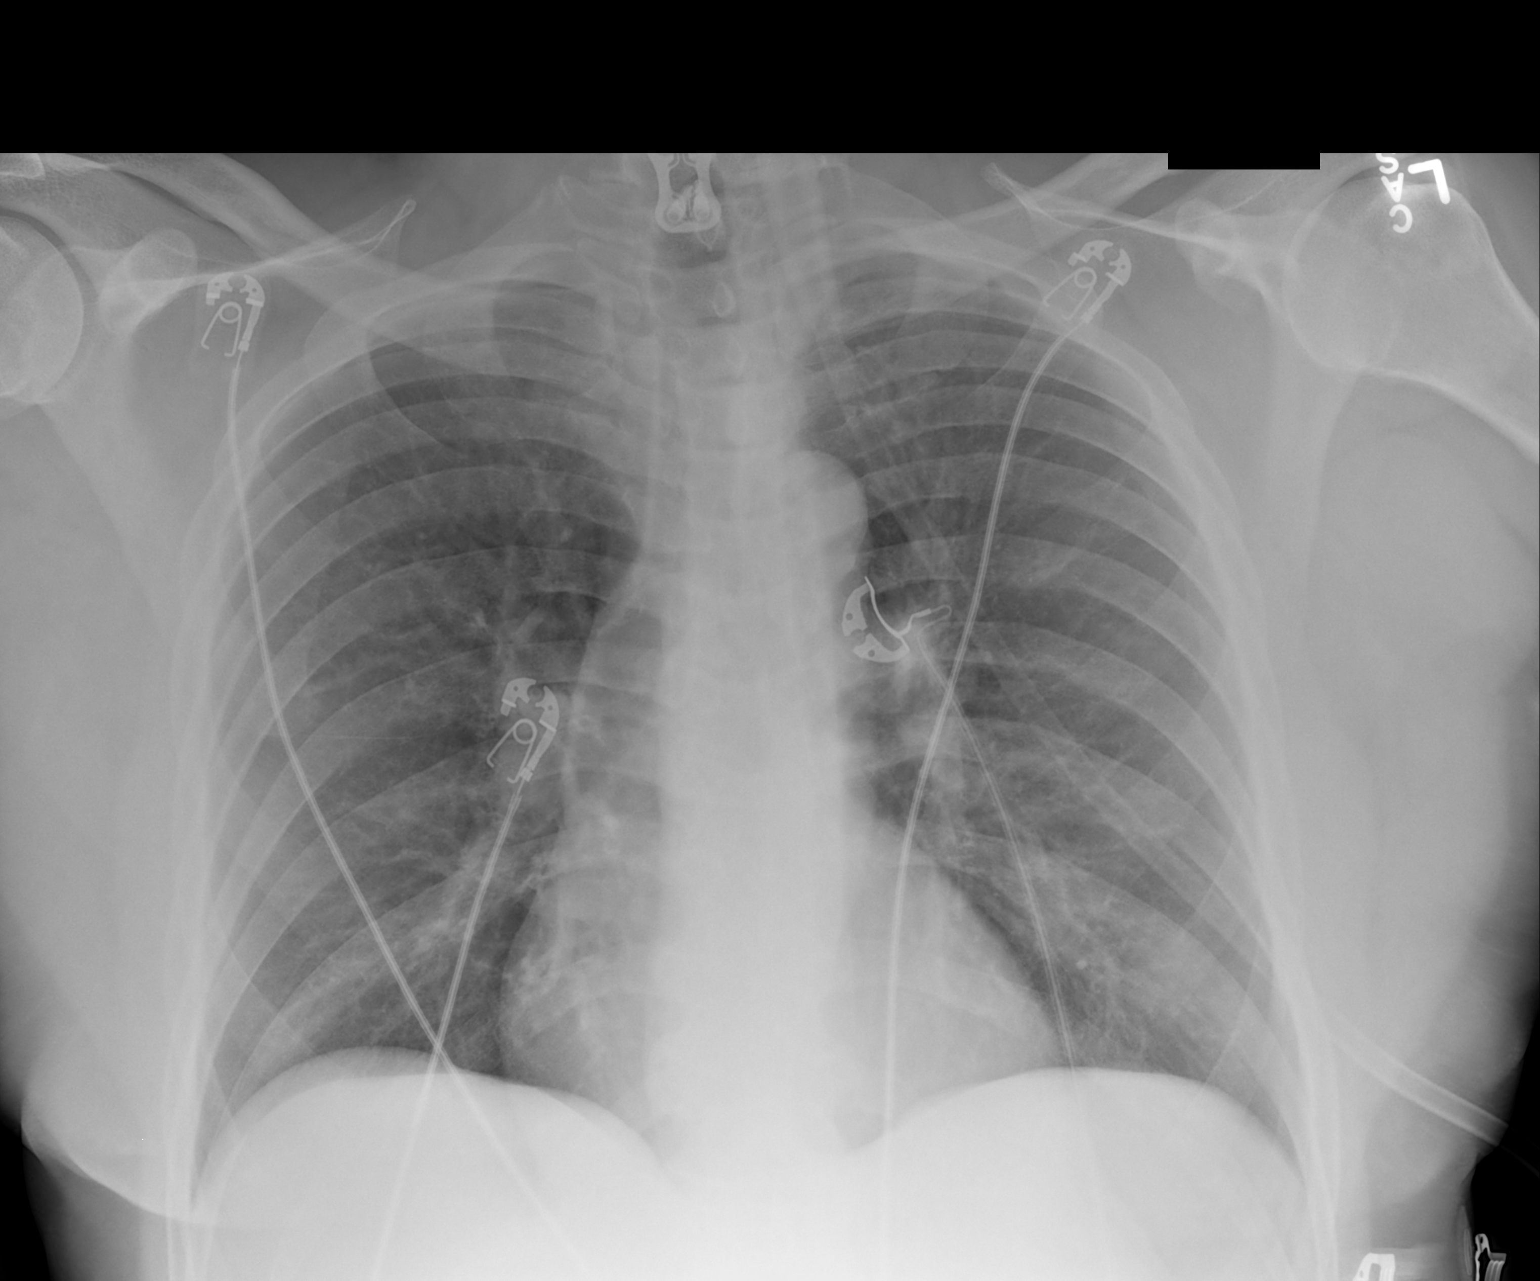

[1 of 1 positions shown; findings below may reference images not displayed]

FINDINGS: 3328 hrs. No focal airspace consolidation, edema, pleural effusion,
pneumothorax. The cardiopericardial silhouette is within normal
limits for size. Imaged bony structures of the thorax are intact.
Telemetry leads overlie the chest. Patient is status post ACDF with
plating in the lower cervical spine.
IMPRESSION: No acute cardiopulmonary findings.

## 2014-09-19 IMAGING — DX DG CHEST 1V PORT
1 series · 1 of 1 positions shown · non-contrast
Comparison: 12/27/2013

CLINICAL DATA: Check endotracheal tube

EXAM:
PORTABLE CHEST - 1 VIEW

[chest]
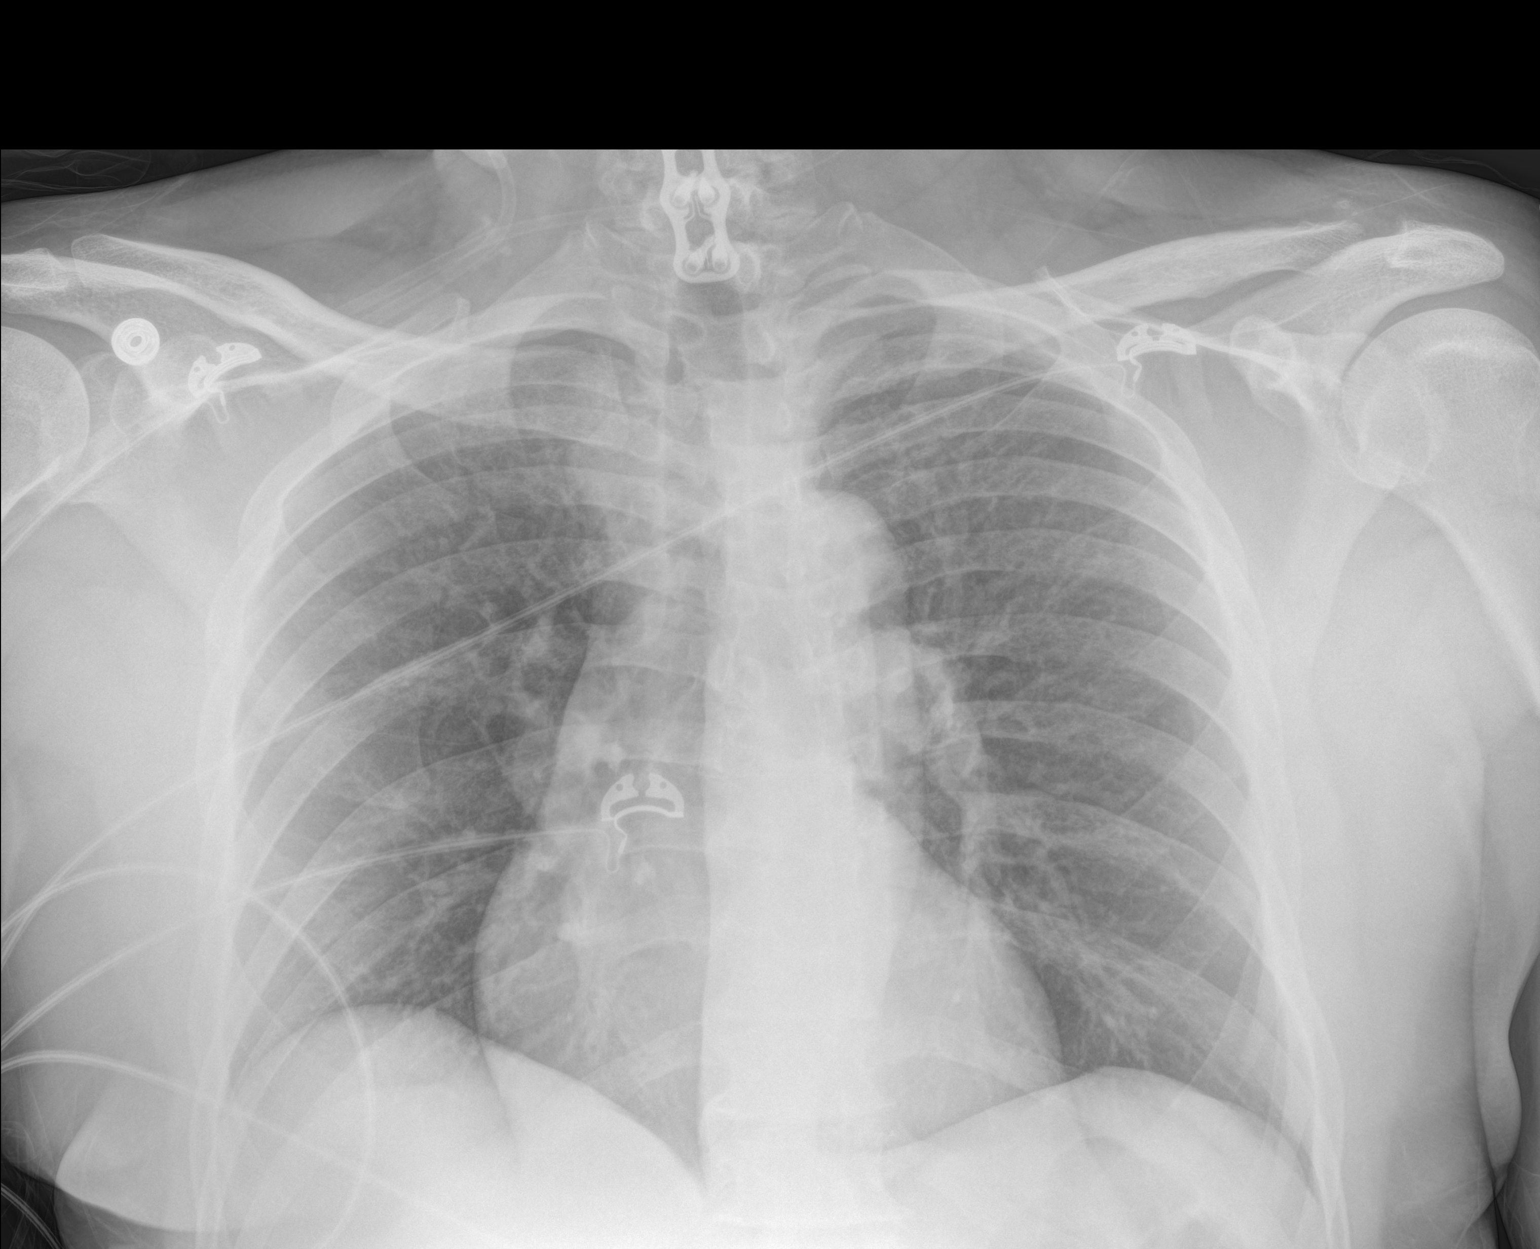

[1 of 1 positions shown; findings below may reference images not displayed]

FINDINGS: Endotracheal tube has been removed previously. Lungs are well
aerated and clear. No infiltrate effusion or edema.
IMPRESSION: No active disease.

## 2014-09-20 DIAGNOSIS — M4806 Spinal stenosis, lumbar region: Secondary | ICD-10-CM | POA: Diagnosis not present

## 2014-09-20 DIAGNOSIS — M519 Unspecified thoracic, thoracolumbar and lumbosacral intervertebral disc disorder: Secondary | ICD-10-CM | POA: Diagnosis not present

## 2014-10-24 DIAGNOSIS — M5030 Other cervical disc degeneration, unspecified cervical region: Secondary | ICD-10-CM | POA: Diagnosis not present

## 2014-10-24 DIAGNOSIS — M4802 Spinal stenosis, cervical region: Secondary | ICD-10-CM | POA: Diagnosis not present

## 2014-10-24 DIAGNOSIS — G894 Chronic pain syndrome: Secondary | ICD-10-CM | POA: Diagnosis not present

## 2014-10-24 DIAGNOSIS — M4806 Spinal stenosis, lumbar region: Secondary | ICD-10-CM | POA: Diagnosis not present

## 2014-10-24 DIAGNOSIS — M519 Unspecified thoracic, thoracolumbar and lumbosacral intervertebral disc disorder: Secondary | ICD-10-CM | POA: Diagnosis not present

## 2014-10-25 DIAGNOSIS — G894 Chronic pain syndrome: Secondary | ICD-10-CM | POA: Diagnosis not present

## 2014-10-25 DIAGNOSIS — G4700 Insomnia, unspecified: Secondary | ICD-10-CM | POA: Diagnosis not present

## 2014-10-25 DIAGNOSIS — J45909 Unspecified asthma, uncomplicated: Secondary | ICD-10-CM | POA: Diagnosis not present

## 2014-10-25 DIAGNOSIS — R5382 Chronic fatigue, unspecified: Secondary | ICD-10-CM | POA: Diagnosis not present

## 2014-10-25 DIAGNOSIS — M5134 Other intervertebral disc degeneration, thoracic region: Secondary | ICD-10-CM | POA: Diagnosis not present

## 2014-11-17 DIAGNOSIS — G894 Chronic pain syndrome: Secondary | ICD-10-CM | POA: Diagnosis not present

## 2014-11-17 DIAGNOSIS — F5221 Male erectile disorder: Secondary | ICD-10-CM | POA: Diagnosis not present

## 2014-11-17 DIAGNOSIS — E780 Pure hypercholesterolemia: Secondary | ICD-10-CM | POA: Diagnosis not present

## 2014-11-17 DIAGNOSIS — K219 Gastro-esophageal reflux disease without esophagitis: Secondary | ICD-10-CM | POA: Diagnosis not present

## 2014-11-17 DIAGNOSIS — R5383 Other fatigue: Secondary | ICD-10-CM | POA: Diagnosis not present

## 2014-11-24 DIAGNOSIS — I10 Essential (primary) hypertension: Secondary | ICD-10-CM | POA: Diagnosis not present

## 2014-11-24 DIAGNOSIS — M129 Arthropathy, unspecified: Secondary | ICD-10-CM | POA: Diagnosis not present

## 2014-11-24 DIAGNOSIS — G4700 Insomnia, unspecified: Secondary | ICD-10-CM | POA: Diagnosis not present

## 2014-11-24 DIAGNOSIS — R5382 Chronic fatigue, unspecified: Secondary | ICD-10-CM | POA: Diagnosis not present

## 2014-11-24 DIAGNOSIS — G894 Chronic pain syndrome: Secondary | ICD-10-CM | POA: Diagnosis not present

## 2014-12-26 DIAGNOSIS — R5382 Chronic fatigue, unspecified: Secondary | ICD-10-CM | POA: Diagnosis not present

## 2014-12-26 DIAGNOSIS — M129 Arthropathy, unspecified: Secondary | ICD-10-CM | POA: Diagnosis not present

## 2014-12-26 DIAGNOSIS — I10 Essential (primary) hypertension: Secondary | ICD-10-CM | POA: Diagnosis not present

## 2014-12-26 DIAGNOSIS — G8929 Other chronic pain: Secondary | ICD-10-CM | POA: Diagnosis not present

## 2015-01-24 DIAGNOSIS — G894 Chronic pain syndrome: Secondary | ICD-10-CM | POA: Diagnosis not present

## 2015-01-24 DIAGNOSIS — M129 Arthropathy, unspecified: Secondary | ICD-10-CM | POA: Diagnosis not present

## 2015-01-24 DIAGNOSIS — I10 Essential (primary) hypertension: Secondary | ICD-10-CM | POA: Diagnosis not present

## 2015-01-24 DIAGNOSIS — J45909 Unspecified asthma, uncomplicated: Secondary | ICD-10-CM | POA: Diagnosis not present

## 2015-02-11 DIAGNOSIS — N179 Acute kidney failure, unspecified: Secondary | ICD-10-CM | POA: Diagnosis not present

## 2015-02-11 DIAGNOSIS — E876 Hypokalemia: Secondary | ICD-10-CM | POA: Diagnosis not present

## 2015-02-11 DIAGNOSIS — J45909 Unspecified asthma, uncomplicated: Secondary | ICD-10-CM | POA: Diagnosis not present

## 2015-02-11 DIAGNOSIS — M6282 Rhabdomyolysis: Secondary | ICD-10-CM | POA: Diagnosis not present

## 2015-02-11 DIAGNOSIS — I10 Essential (primary) hypertension: Secondary | ICD-10-CM | POA: Diagnosis not present

## 2015-02-11 DIAGNOSIS — R404 Transient alteration of awareness: Secondary | ICD-10-CM | POA: Diagnosis not present

## 2015-02-11 DIAGNOSIS — R55 Syncope and collapse: Secondary | ICD-10-CM | POA: Diagnosis not present

## 2015-02-23 DIAGNOSIS — J45909 Unspecified asthma, uncomplicated: Secondary | ICD-10-CM | POA: Diagnosis not present

## 2015-02-23 DIAGNOSIS — F419 Anxiety disorder, unspecified: Secondary | ICD-10-CM | POA: Diagnosis not present

## 2015-02-23 DIAGNOSIS — J329 Chronic sinusitis, unspecified: Secondary | ICD-10-CM | POA: Diagnosis not present

## 2015-02-23 DIAGNOSIS — G894 Chronic pain syndrome: Secondary | ICD-10-CM | POA: Diagnosis not present

## 2015-03-27 DIAGNOSIS — G8929 Other chronic pain: Secondary | ICD-10-CM | POA: Diagnosis not present

## 2015-03-27 DIAGNOSIS — G4700 Insomnia, unspecified: Secondary | ICD-10-CM | POA: Diagnosis not present

## 2015-03-27 DIAGNOSIS — F322 Major depressive disorder, single episode, severe without psychotic features: Secondary | ICD-10-CM | POA: Diagnosis not present

## 2015-03-27 DIAGNOSIS — I10 Essential (primary) hypertension: Secondary | ICD-10-CM | POA: Diagnosis not present

## 2015-04-25 DIAGNOSIS — I10 Essential (primary) hypertension: Secondary | ICD-10-CM | POA: Diagnosis not present

## 2015-04-25 DIAGNOSIS — K529 Noninfective gastroenteritis and colitis, unspecified: Secondary | ICD-10-CM | POA: Diagnosis not present

## 2015-04-25 DIAGNOSIS — G894 Chronic pain syndrome: Secondary | ICD-10-CM | POA: Diagnosis not present

## 2015-04-25 DIAGNOSIS — G4700 Insomnia, unspecified: Secondary | ICD-10-CM | POA: Diagnosis not present

## 2015-05-25 DIAGNOSIS — M129 Arthropathy, unspecified: Secondary | ICD-10-CM | POA: Diagnosis not present

## 2015-05-25 DIAGNOSIS — K219 Gastro-esophageal reflux disease without esophagitis: Secondary | ICD-10-CM | POA: Diagnosis not present

## 2015-05-25 DIAGNOSIS — Z5181 Encounter for therapeutic drug level monitoring: Secondary | ICD-10-CM | POA: Diagnosis not present

## 2015-05-25 DIAGNOSIS — G4700 Insomnia, unspecified: Secondary | ICD-10-CM | POA: Diagnosis not present

## 2015-05-25 DIAGNOSIS — R972 Elevated prostate specific antigen [PSA]: Secondary | ICD-10-CM | POA: Diagnosis not present

## 2015-05-25 DIAGNOSIS — R5383 Other fatigue: Secondary | ICD-10-CM | POA: Diagnosis not present

## 2015-05-25 DIAGNOSIS — E039 Hypothyroidism, unspecified: Secondary | ICD-10-CM | POA: Diagnosis not present

## 2015-05-25 DIAGNOSIS — R531 Weakness: Secondary | ICD-10-CM | POA: Diagnosis not present

## 2015-05-25 DIAGNOSIS — I10 Essential (primary) hypertension: Secondary | ICD-10-CM | POA: Diagnosis not present

## 2015-05-25 DIAGNOSIS — K529 Noninfective gastroenteritis and colitis, unspecified: Secondary | ICD-10-CM | POA: Diagnosis not present

## 2015-05-25 DIAGNOSIS — G894 Chronic pain syndrome: Secondary | ICD-10-CM | POA: Diagnosis not present

## 2015-05-25 DIAGNOSIS — Z79891 Long term (current) use of opiate analgesic: Secondary | ICD-10-CM | POA: Diagnosis not present

## 2015-06-22 DIAGNOSIS — G4700 Insomnia, unspecified: Secondary | ICD-10-CM | POA: Diagnosis not present

## 2015-06-22 DIAGNOSIS — I10 Essential (primary) hypertension: Secondary | ICD-10-CM | POA: Diagnosis not present

## 2015-06-22 DIAGNOSIS — M1990 Unspecified osteoarthritis, unspecified site: Secondary | ICD-10-CM | POA: Diagnosis not present

## 2015-06-22 DIAGNOSIS — G894 Chronic pain syndrome: Secondary | ICD-10-CM | POA: Diagnosis not present

## 2015-07-26 DIAGNOSIS — G894 Chronic pain syndrome: Secondary | ICD-10-CM | POA: Diagnosis not present

## 2015-07-26 DIAGNOSIS — I10 Essential (primary) hypertension: Secondary | ICD-10-CM | POA: Diagnosis not present

## 2015-07-26 DIAGNOSIS — J45909 Unspecified asthma, uncomplicated: Secondary | ICD-10-CM | POA: Diagnosis not present

## 2015-07-26 DIAGNOSIS — T7840XA Allergy, unspecified, initial encounter: Secondary | ICD-10-CM | POA: Diagnosis not present

## 2015-10-11 DIAGNOSIS — G894 Chronic pain syndrome: Secondary | ICD-10-CM | POA: Diagnosis not present

## 2016-01-16 DIAGNOSIS — G894 Chronic pain syndrome: Secondary | ICD-10-CM | POA: Diagnosis not present

## 2016-02-21 DIAGNOSIS — M1990 Unspecified osteoarthritis, unspecified site: Secondary | ICD-10-CM | POA: Diagnosis not present

## 2016-02-21 DIAGNOSIS — G894 Chronic pain syndrome: Secondary | ICD-10-CM | POA: Diagnosis not present

## 2016-03-25 DIAGNOSIS — S63112A Subluxation of metacarpophalangeal joint of left thumb, initial encounter: Secondary | ICD-10-CM | POA: Diagnosis not present

## 2016-03-25 DIAGNOSIS — M19042 Primary osteoarthritis, left hand: Secondary | ICD-10-CM | POA: Diagnosis not present

## 2016-03-25 DIAGNOSIS — M79645 Pain in left finger(s): Secondary | ICD-10-CM | POA: Diagnosis not present

## 2016-03-25 DIAGNOSIS — G894 Chronic pain syndrome: Secondary | ICD-10-CM | POA: Diagnosis not present

## 2016-03-25 DIAGNOSIS — Z23 Encounter for immunization: Secondary | ICD-10-CM | POA: Diagnosis not present

## 2016-04-23 DIAGNOSIS — G4700 Insomnia, unspecified: Secondary | ICD-10-CM | POA: Diagnosis not present

## 2016-04-23 DIAGNOSIS — G894 Chronic pain syndrome: Secondary | ICD-10-CM | POA: Diagnosis not present

## 2016-04-23 DIAGNOSIS — F419 Anxiety disorder, unspecified: Secondary | ICD-10-CM | POA: Diagnosis not present

## 2016-05-23 DIAGNOSIS — F419 Anxiety disorder, unspecified: Secondary | ICD-10-CM | POA: Diagnosis not present

## 2016-05-23 DIAGNOSIS — I10 Essential (primary) hypertension: Secondary | ICD-10-CM | POA: Diagnosis not present

## 2016-05-23 DIAGNOSIS — G894 Chronic pain syndrome: Secondary | ICD-10-CM | POA: Diagnosis not present

## 2016-05-23 DIAGNOSIS — G4700 Insomnia, unspecified: Secondary | ICD-10-CM | POA: Diagnosis not present

## 2016-06-03 DIAGNOSIS — M19042 Primary osteoarthritis, left hand: Secondary | ICD-10-CM | POA: Diagnosis not present

## 2017-10-27 ENCOUNTER — Ambulatory Visit (INDEPENDENT_AMBULATORY_CARE_PROVIDER_SITE_OTHER): Payer: Medicare HMO | Admitting: Podiatry

## 2017-10-27 DIAGNOSIS — L603 Nail dystrophy: Secondary | ICD-10-CM

## 2017-10-29 NOTE — Progress Notes (Signed)
   Subjective: 52 year old male presenting today as a new patient with a chief complaint of thickening and discoloration of the left great toenail that began several years ago. He states he has had the nail removed in the past but it has grown back with the same symptoms. There are no modifying factors noted. Patient presents today for further treatment and evaluation.  Past Medical History:  Diagnosis Date  . Asthma   . Hypertension     Objective: Physical Exam General: The patient is alert and oriented x3 in no acute distress.  Dermatology: Hyperkeratotic, discolored, thickened, onychodystrophy of bilateral great toenails.   Skin is warm, dry and supple bilateral lower extremities. Negative for open lesions or macerations.  Vascular: Palpable pedal pulses bilaterally. No edema or erythema noted. Capillary refill within normal limits.  Neurological: Epicritic and protective threshold grossly intact bilaterally.   Musculoskeletal Exam: Range of motion within normal limits to all pedal and ankle joints bilateral. Muscle strength 5/5 in all groups bilateral.   Assessment: #1 dystrophic bilateral great toenails   Plan of Care:  #1 Patient was evaluated. #2 Biopsy of the left great toenail taken today.  #3 Future treatment will include either Lamisil 250 mg #90 or total permanent nail avulsion procedure depending on nail biopsy results.  #4 Return to clinic in 4 weeks.    Felecia ShellingBrent M. Evans, DPM [facility]  Dr. Felecia ShellingBrent M. Evans, DPM    [address]                                        [place], [place] [address]                Office [phone]  Fax [phone]) [phone]

## 2017-11-23 ENCOUNTER — Other Ambulatory Visit: Payer: Self-pay

## 2017-11-23 ENCOUNTER — Telehealth (HOSPITAL_COMMUNITY): Payer: Self-pay | Admitting: *Deleted

## 2017-11-23 ENCOUNTER — Ambulatory Visit (HOSPITAL_COMMUNITY)
Admission: EM | Admit: 2017-11-23 | Discharge: 2017-11-23 | Disposition: A | Discharge status: Home / Self Care | Payer: Medicare HMO | Attending: Family Medicine | Admitting: Family Medicine

## 2017-11-23 DIAGNOSIS — M5441 Lumbago with sciatica, right side: Secondary | ICD-10-CM | POA: Diagnosis not present

## 2017-11-23 MED ORDER — CYCLOBENZAPRINE HCL 10 MG PO TABS: 10.0000 mg | ORAL_TABLET | Freq: Two times a day (BID) | ORAL | 0 refills | Status: DC | PRN

## 2017-11-23 MED ORDER — PREDNISONE 20 MG PO TABS: ORAL_TABLET | ORAL | 0 refills | Status: AC

## 2017-11-23 MED ORDER — PREDNISONE 20 MG PO TABS: ORAL_TABLET | ORAL | 0 refills | Status: DC

## 2017-11-23 NOTE — ED Provider Notes (Signed)
11/23/2017 12:31 PM   DOB: 07-10-1965 / MRN: [id number]  SUBJECTIVE:  Francisco Ramos is a 52 y.o. male presenting for right sided low back pain with radicular pattern to the right posterior thigh that started 4 days ago and is worseing. Takes chronic narcotics but pain is pushing through. Has history of spondyloarthritis and is established with ortho in [place]. No history of diabetes.   He has No Known Allergies.   He  has a past medical history of Asthma and Hypertension.    He  reports that he has never smoked. He does not have any smokeless tobacco history on file. He reports that he drinks alcohol. He reports that he does not use drugs. He  has no sexual activity history on file. The patient  has a past surgical history that includes neck fusion.  His family history is not on file.  Review of Systems  Constitutional: Negative for chills, fever, malaise/fatigue and weight loss.  Respiratory: Negative for cough and shortness of breath.   Cardiovascular: Negative for chest pain and leg swelling.  Gastrointestinal: Negative for nausea.  Genitourinary: Negative for dysuria, flank pain, frequency, hematuria and urgency.  Musculoskeletal: Positive for back pain and myalgias. Negative for falls, joint pain and neck pain.  Skin: Negative.   Neurological: Negative for dizziness.    OBJECTIVE:  BP 137/83 (BP Location: Left Arm)   Pulse 96   Temp 99.5 F (37.5 C) (Oral)   Resp 20   SpO2 96%   Physical Exam  Constitutional: He is oriented to person, place, and time. He appears well-developed. He is active and cooperative. He does not appear ill.  Eyes: Pupils are equal, round, and reactive to light. Conjunctivae and EOM are normal.  Cardiovascular: Normal rate.  Pulmonary/Chest: Effort normal. No tachypnea.  Abdominal: He exhibits no distension.  Musculoskeletal: Normal range of motion. He exhibits edema and tenderness (right lumbar paraspinals and into right sciatic distribution). He  exhibits no deformity.  Neurological: He is alert and oriented to person, place, and time. He displays normal reflexes. No cranial nerve deficit or sensory deficit. He exhibits normal muscle tone. Coordination normal.  Skin: Skin is warm and dry. He is not diaphoretic.  Psychiatric: He has a normal mood and affect.  Nursing note and vitals reviewed.     No results found for this or any previous visit (from the past 72 hour(s)).  No results found.  ASSESSMENT AND PLAN:  No orders of the defined types were placed in this encounter.    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  No hx of DM2. Pred and flexeril along with his chronic narcotic therapy.      The patient is advised to call or return to clinic if he does not see an improvement in symptoms, or to seek the care of the closest emergency department if he worsens with the above plan.   Deliah Boston, MHS, PA-C 11/23/2017 12:31 PM    Ofilia Neas, PA-C 11/23/17 1231

## 2017-11-23 NOTE — ED Triage Notes (Signed)
Pt c/o lower back pain on the right side, per pt it is radiating down to his hip, per pt he would like to have an xray

## 2017-11-23 NOTE — Telephone Encounter (Signed)
Per pt he wanted his medication go to [employer] [facility]. Medication was sent

## 2017-11-23 NOTE — Discharge Instructions (Addendum)
Try the prednisone and flexeril along with your narcotic meds.  Hopefully you will return to your baseline and will not need to see orthopedics.  Use NSAIDS sparingly while taking prednisone.

## 2017-11-24 ENCOUNTER — Ambulatory Visit (INDEPENDENT_AMBULATORY_CARE_PROVIDER_SITE_OTHER): Payer: Medicare HMO | Admitting: Podiatry

## 2017-11-24 ENCOUNTER — Encounter: Payer: Self-pay | Admitting: Podiatry

## 2017-11-24 DIAGNOSIS — B351 Tinea unguium: Secondary | ICD-10-CM | POA: Diagnosis not present

## 2017-11-24 MED ORDER — GENTAMICIN SULFATE 0.1 % EX CREA: 1.0000 "application " | TOPICAL_CREAM | Freq: Three times a day (TID) | CUTANEOUS | 1 refills | Status: DC

## 2017-11-24 NOTE — Patient Instructions (Signed)

## 2017-11-26 NOTE — Progress Notes (Signed)
   Subjective: 52 year old male presenting today for follow up evaluation of dystrophic bilateral great toenails. He is here to have the nails removed and does not want medication therapy. Patient presents today for further treatment and evaluation.  Past Medical History:  Diagnosis Date  . Asthma   . Hypertension     Objective: Physical Exam General: The patient is alert and oriented x3 in no acute distress.  Dermatology: Hyperkeratotic, discolored, thickened, onychodystrophy of bilateral great toenails.   Skin is warm, dry and supple bilateral lower extremities. Negative for open lesions or macerations.  Vascular: Palpable pedal pulses bilaterally. No edema or erythema noted. Capillary refill within normal limits.  Neurological: Epicritic and protective threshold grossly intact bilaterally.   Musculoskeletal Exam: Range of motion within normal limits to all pedal and ankle joints bilateral. Muscle strength 5/5 in all groups bilateral.   Assessment: #1 onychomycosis bilateral great toenails   Plan of Care:  #1 Patient was evaluated. #2 Patient opted for permanent total nail avulsion to bilateral great toenails.  #3 Prior to procedure, local anesthesia infiltration utilized using 3 ml of a 50:50 mixture of 2% plain lidocaine and 0.5% plain marcaine in a normal hallux block fashion and a betadine prep performed.  #4 Total permanent nail avulsion with chemical matrixectomy performed using 3x30sec applications of phenol followed by alcohol flush.  #5 Light dressing applied. #6 Prescription for Gentamicin cream provided to patient.  #7 Return to clinic in 2 weeks.    Felecia Shelling, DPM [facility]  Dr. Felecia Shelling, DPM    [address]                                        [place], [place] [address]                Office [phone]  Fax [phone]

## 2017-12-08 ENCOUNTER — Encounter: Payer: Self-pay | Admitting: Podiatry

## 2017-12-08 ENCOUNTER — Ambulatory Visit (INDEPENDENT_AMBULATORY_CARE_PROVIDER_SITE_OTHER): Payer: Medicare HMO | Admitting: Podiatry

## 2017-12-08 DIAGNOSIS — L603 Nail dystrophy: Secondary | ICD-10-CM | POA: Diagnosis not present

## 2017-12-10 NOTE — Progress Notes (Signed)
   Subjective: Patient presents today 2 weeks post total permanent nail avulsion procedures of bilateral great toes. Patient states that the toe and nail fold is feeling much better. He states he is soaking the toes 2-3 times daily and applying antibiotic ointment for treatment. There are no modifying factors noted. Patient is here for further evaluation and treatment.   Past Medical History:  Diagnosis Date  . Asthma   . Hypertension     Objective: Skin is warm, dry and supple. Respective nail bed appears to be healing appropriately. Open wound to the associated nail bed with a granular wound base and moderate amount of fibrotic tissue. Minimal drainage noted. Mild erythema around the periungual region likely due to phenol chemical matricectomy.  Assessment: #1 postop permanent total nail avulsion bilateral great toes #2 open wound periungual nail bed of respective digit.   Plan of care: #1 patient was evaluated  #2 debridement of open wound was performed to the periungual border of the respective toe using a currette. Antibiotic ointment and Band-Aid was applied. #3 patient is to return to clinic on a PRN basis.   Felecia ShellingBrent M. Thoma Paulsen, DPM [facility]  Dr. Felecia ShellingBrent M. Ariadna Setter, DPM    [address]                                        [place], [place] [address]                Office [phone]  Fax [phone]

## 2021-04-20 ENCOUNTER — Ambulatory Visit: Payer: Self-pay | Admitting: Surgery
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
## 2021-05-10 NOTE — Progress Notes (Addendum)
COVID swab appointment: n/a  COVID Vaccine Completed: yes x5 Date COVID Vaccine completed: Has received booster: COVID vaccine manufacturer:    Moderna     Date of COVID positive in last 90 days: no  PCP - Lerry Liner, MD Cardiologist - n/a  Chest x-ray - n/a EKG - 05/11/21 Epic/chart Stress Test - n/a ECHO - 2015 Cardiac Cath - n/a Pacemaker/ICD device last checked: n/a Spinal Cord Stimulator: n/a  Sleep Study - n/a CPAP -   Fasting Blood Sugar - n/a Checks Blood Sugar _____ times a day  Blood Thinner Instructions: n/a Aspirin Instructions: Last Dose:  Activity level: Can go up a flight of stairs and perform activities of daily living without stopping and without symptoms of chest pain or shortness of breath.     Anesthesia review: HTN, asthma, K+ 2.9  Patient denies shortness of breath, fever, cough and chest pain at PAT appointment   Patient verbalized understanding of instructions that were given to them at the PAT appointment. Patient was also instructed that they will need to review over the PAT instructions again at home before surgery.

## 2021-05-10 NOTE — Patient Instructions (Addendum)
DUE TO COVID-19 ONLY ONE VISITOR IS ALLOWED TO COME WITH YOU AND STAY IN THE WAITING ROOM ONLY DURING PRE OP AND PROCEDURE.   **NO VISITORS ARE ALLOWED IN THE SHORT STAY AREA OR RECOVERY ROOM!!**       Your procedure is scheduled on: 05/18/21   Report to [facility] Main Entrance    Report to admitting at 6:45 AM   Call this number if you have problems the morning of surgery [phone]   Do not eat food :After Midnight.   May have liquids until 6:00 AM day of surgery  CLEAR LIQUID DIET  Foods Allowed                                                                     Foods Excluded  Water, Black Coffee and tea (no milk or creamer)          liquids that you cannot  Plain Jell-O in any flavor  (No red)                                   see through such as: Fruit ices (not with fruit pulp)                                           milk, soups, orange juice              Iced Popsicles (No red)                                               All solid food                                   Apple juices Sports drinks like Gatorade (No red) Lightly seasoned clear broth or consume(fat free) Sugar    Oral Hygiene is also important to reduce your risk of infection.                                    Remember - BRUSH YOUR TEETH THE MORNING OF SURGERY WITH YOUR REGULAR TOOTHPASTE    Take these medicines the morning of surgery with A SIP OF WATER: Inhalers, Xanax, Amlodipine, Lexapro                              You may not have any metal on your body including jewelry, and body piercing             Do not wear lotions, powders, cologne, or deodorant              Men may shave face and neck.   Do not bring valuables to the hospital. [facility] IS NOT  RESPONSIBLE   FOR VALUABLES.    Patients discharged on the day of surgery will not be allowed to drive home.   Special Instructions: Bring a copy of your healthcare power of attorney and living will documents          the day of surgery if you haven't scanned them before.              Please read over the following fact sheets you were given: IF YOU HAVE QUESTIONS ABOUT YOUR PRE-OP INSTRUCTIONS PLEASE CALL [phone]- [facility] - Preparing for Surgery Before surgery, you can play an important role.  Because skin is not sterile, your skin needs to be as free of germs as possible.  You can reduce the number of germs on your skin by washing with CHG (chlorahexidine gluconate) soap before surgery.  CHG is an antiseptic cleaner which kills germs and bonds with the skin to continue killing germs even after washing. Please DO NOT use if you have an allergy to CHG or antibacterial soaps.  If your skin becomes reddened/irritated stop using the CHG and inform your nurse when you arrive at Short Stay. Do not shave (including legs and underarms) for at least 48 hours prior to the first CHG shower.  You may shave your face/neck.  Please follow these instructions carefully:  1.  Shower with CHG Soap the night before surgery and the  morning of surgery.  2.  If you choose to wash your hair, wash your hair first as usual with your normal  shampoo.  3.  After you shampoo, rinse your hair and body thoroughly to remove the shampoo.                             4.  Use CHG as you would any other liquid soap.  You can apply chg directly to the skin and wash.  Gently with a scrungie or clean washcloth.  5.  Apply the CHG Soap to your body ONLY FROM THE NECK DOWN.   Do   not use on face/ open                           Wound or open sores. Avoid contact with eyes, ears mouth and   genitals (private parts).                       Wash face,  Genitals (private parts) with your normal soap.             6.  Wash thoroughly, paying special attention to the area where your    surgery  will be performed.  7.  Thoroughly rinse your body with warm water from the neck down.  8.  DO NOT shower/wash with your normal soap after using  and rinsing off the CHG Soap.                9.  Pat yourself dry with a clean towel.            10.  Wear clean pajamas.            11.  Place clean sheets on your bed the night of your first shower and do not  sleep with pets. Day of Surgery : Do not apply any lotions/deodorants the morning of surgery.  Please wear clean clothes to the hospital/surgery  center.  FAILURE TO FOLLOW THESE INSTRUCTIONS MAY RESULT IN THE CANCELLATION OF YOUR SURGERY  PATIENT SIGNATURE_________________________________  NURSE SIGNATURE__________________________________  ________________________________________________________________________

## 2021-05-11 ENCOUNTER — Encounter (HOSPITAL_COMMUNITY)
Admission: RE | Admit: 2021-05-11 | Discharge: 2021-05-11 | Disposition: A | Discharge status: Home / Self Care | Payer: Medicare HMO | Source: Ambulatory Visit | Attending: Surgery | Admitting: Surgery

## 2021-05-11 ENCOUNTER — Encounter (HOSPITAL_COMMUNITY): Payer: Self-pay

## 2021-05-11 ENCOUNTER — Other Ambulatory Visit: Payer: Self-pay

## 2021-05-11 VITALS — BP 115/85 | HR 60 | Temp 97.6°F | Resp 18 | Ht 71.0 in | Wt 188.4 lb

## 2021-05-11 DIAGNOSIS — Z01818 Encounter for other preprocedural examination: Secondary | ICD-10-CM | POA: Insufficient documentation

## 2021-05-11 DIAGNOSIS — Z7951 Long term (current) use of inhaled steroids: Secondary | ICD-10-CM | POA: Insufficient documentation

## 2021-05-11 DIAGNOSIS — J45909 Unspecified asthma, uncomplicated: Secondary | ICD-10-CM | POA: Insufficient documentation

## 2021-05-11 DIAGNOSIS — K430 Incisional hernia with obstruction, without gangrene: Secondary | ICD-10-CM | POA: Insufficient documentation

## 2021-05-11 DIAGNOSIS — I119 Hypertensive heart disease without heart failure: Secondary | ICD-10-CM | POA: Diagnosis not present

## 2021-05-11 DIAGNOSIS — Z79899 Other long term (current) drug therapy: Secondary | ICD-10-CM | POA: Insufficient documentation

## 2021-05-11 DIAGNOSIS — I2510 Atherosclerotic heart disease of native coronary artery without angina pectoris: Secondary | ICD-10-CM | POA: Insufficient documentation

## 2021-05-11 HISTORY — DX: Unspecified osteoarthritis, unspecified site: M19.90

## 2021-05-11 LAB — BASIC METABOLIC PANEL
Anion gap: 10 (ref 5–15)
BUN: 27 mg/dL — ABNORMAL HIGH (ref 6–20)
CO2: 25 mmol/L (ref 22–32)
Calcium: 9.1 mg/dL (ref 8.9–10.3)
Chloride: 102 mmol/L (ref 98–111)
Creatinine, Ser: 0.99 mg/dL (ref 0.61–1.24)
GFR, Estimated: >60 mL/min (ref >60)
Glucose, Bld: 101 mg/dL — ABNORMAL HIGH (ref 70–99)
Potassium: 2.9 mmol/L — ABNORMAL LOW (ref 3.5–5.1)
Sodium: 137 mmol/L (ref 135–145)

## 2021-05-11 LAB — CBC
HCT: 41.9 % (ref 39.0–52.0)
Hemoglobin: 14.4 g/dL (ref 13.0–17.0)
MCH: 32.1 pg (ref 26.0–34.0)
MCHC: 34.4 g/dL (ref 30.0–36.0)
MCV: 93.3 fL (ref 80.0–100.0)
Platelets: 304 10*3/uL (ref 150–400)
RBC: 4.49 MIL/uL (ref 4.22–5.81)
RDW: 12.8 % (ref 11.5–15.5)
WBC: 9.4 10*3/uL (ref 4.0–10.5)
nRBC: 0 % (ref 0.0–0.2)
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
## 2021-05-18 ENCOUNTER — Ambulatory Visit (HOSPITAL_COMMUNITY): Payer: Medicare HMO | Admitting: Certified Registered"

## 2021-05-18 ENCOUNTER — Encounter (HOSPITAL_COMMUNITY)
Admission: RE | Disposition: A | Discharge status: Home / Self Care | Payer: Self-pay | Source: Ambulatory Visit | Attending: Surgery

## 2021-05-18 ENCOUNTER — Ambulatory Visit (HOSPITAL_COMMUNITY)
Admission: RE | Admit: 2021-05-18 | Discharge: 2021-05-18 | Disposition: A | Discharge status: Home / Self Care | Payer: Medicare HMO | Source: Ambulatory Visit | Attending: Surgery | Admitting: Surgery

## 2021-05-18 ENCOUNTER — Encounter (HOSPITAL_COMMUNITY): Payer: Self-pay | Admitting: Surgery

## 2021-05-18 DIAGNOSIS — Z79899 Other long term (current) drug therapy: Secondary | ICD-10-CM | POA: Diagnosis not present

## 2021-05-18 DIAGNOSIS — K420 Umbilical hernia with obstruction, without gangrene: Secondary | ICD-10-CM | POA: Diagnosis present

## 2021-05-18 DIAGNOSIS — M1990 Unspecified osteoarthritis, unspecified site: Secondary | ICD-10-CM | POA: Diagnosis not present

## 2021-05-18 DIAGNOSIS — J45909 Unspecified asthma, uncomplicated: Secondary | ICD-10-CM | POA: Insufficient documentation

## 2021-05-18 DIAGNOSIS — Z8781 Personal history of (healed) traumatic fracture: Secondary | ICD-10-CM | POA: Diagnosis not present

## 2021-05-18 DIAGNOSIS — I10 Essential (primary) hypertension: Secondary | ICD-10-CM | POA: Diagnosis not present

## 2021-05-18 HISTORY — PX: VENTRAL HERNIA REPAIR: SHX424

## 2021-05-18 LAB — POTASSIUM: Potassium: 3.1 mmol/L — ABNORMAL LOW (ref 3.5–5.1)

## 2021-05-18 SURGERY — REPAIR, HERNIA, VENTRAL, LAPAROSCOPIC
Anesthesia: General | Site: Abdomen

## 2021-05-18 MED ORDER — DEXAMETHASONE SODIUM PHOSPHATE 10 MG/ML IJ SOLN
INTRAMUSCULAR | Status: DC | PRN
  Administered 2021-05-18: 10 mg via INTRAVENOUS

## 2021-05-18 MED ORDER — METHOCARBAMOL 500 MG PO TABS: 500.0000 mg | ORAL_TABLET | Freq: Four times a day (QID) | ORAL | 1 refills | Status: AC | PRN

## 2021-05-18 MED ORDER — ROCURONIUM BROMIDE 10 MG/ML (PF) SYRINGE
PREFILLED_SYRINGE | INTRAVENOUS | Status: AC
  Filled 2021-05-18: qty 10

## 2021-05-18 MED ORDER — MIDAZOLAM HCL 2 MG/2ML IJ SOLN
INTRAMUSCULAR | Status: AC
  Filled 2021-05-18: qty 2

## 2021-05-18 MED ORDER — OXYCODONE HCL 5 MG PO TABS
ORAL_TABLET | ORAL | Status: AC
  Filled 2021-05-18: qty 1

## 2021-05-18 MED ORDER — PROPOFOL 10 MG/ML IV BOLUS
INTRAVENOUS | Status: DC | PRN
  Administered 2021-05-18: 200 mg via INTRAVENOUS

## 2021-05-18 MED ORDER — FENTANYL CITRATE PF 50 MCG/ML IJ SOSY: 25.0000 ug | PREFILLED_SYRINGE | INTRAMUSCULAR | Status: DC | PRN

## 2021-05-18 MED ORDER — GLYCOPYRROLATE 0.2 MG/ML IJ SOLN
INTRAMUSCULAR | Status: DC | PRN
  Administered 2021-05-18: .2 mg via INTRAVENOUS

## 2021-05-18 MED ORDER — KETAMINE HCL 10 MG/ML IJ SOLN
INTRAMUSCULAR | Status: AC
  Filled 2021-05-18: qty 1

## 2021-05-18 MED ORDER — ALBUTEROL SULFATE HFA 108 (90 BASE) MCG/ACT IN AERS
INHALATION_SPRAY | RESPIRATORY_TRACT | Status: AC
  Filled 2021-05-18: qty 6.7

## 2021-05-18 MED ORDER — CHLORHEXIDINE GLUCONATE 4 % EX LIQD: 60.0000 mL | Freq: Once | CUTANEOUS | Status: DC

## 2021-05-18 MED ORDER — OXYCODONE HCL 5 MG PO TABS
5.0000 mg | ORAL_TABLET | ORAL | Status: DC | PRN
  Administered 2021-05-18: 5 mg via ORAL

## 2021-05-18 MED ORDER — SUGAMMADEX SODIUM 500 MG/5ML IV SOLN
INTRAVENOUS | Status: AC
  Filled 2021-05-18: qty 5

## 2021-05-18 MED ORDER — SUGAMMADEX SODIUM 500 MG/5ML IV SOLN
INTRAVENOUS | Status: DC | PRN
  Administered 2021-05-18: 400 mg via INTRAVENOUS

## 2021-05-18 MED ORDER — DOCUSATE SODIUM 100 MG PO CAPS: 100.0000 mg | ORAL_CAPSULE | Freq: Two times a day (BID) | ORAL | 0 refills | Status: AC

## 2021-05-18 MED ORDER — CEFAZOLIN SODIUM-DEXTROSE 2-4 GM/100ML-% IV SOLN
2.0000 g | INTRAVENOUS | Status: AC
  Administered 2021-05-18: 2 g via INTRAVENOUS
  Filled 2021-05-18: qty 100

## 2021-05-18 MED ORDER — BUPIVACAINE-EPINEPHRINE 0.25% -1:200000 IJ SOLN
INTRAMUSCULAR | Status: DC | PRN
  Administered 2021-05-18: 30 mL

## 2021-05-18 MED ORDER — SODIUM CHLORIDE 0.9% FLUSH: 3.0000 mL | Freq: Two times a day (BID) | INTRAVENOUS | Status: DC

## 2021-05-18 MED ORDER — LIDOCAINE HCL (PF) 2 % IJ SOLN
INTRAMUSCULAR | Status: AC
  Filled 2021-05-18: qty 5

## 2021-05-18 MED ORDER — FENTANYL CITRATE (PF) 100 MCG/2ML IJ SOLN
INTRAMUSCULAR | Status: DC | PRN
  Administered 2021-05-18 (×6): 50 ug via INTRAVENOUS

## 2021-05-18 MED ORDER — FENTANYL CITRATE (PF) 100 MCG/2ML IJ SOLN
INTRAMUSCULAR | Status: AC
  Filled 2021-05-18: qty 2

## 2021-05-18 MED ORDER — ALBUTEROL SULFATE HFA 108 (90 BASE) MCG/ACT IN AERS
INHALATION_SPRAY | RESPIRATORY_TRACT | Status: DC | PRN
  Administered 2021-05-18 (×2): 2 via RESPIRATORY_TRACT

## 2021-05-18 MED ORDER — ACETAMINOPHEN 650 MG RE SUPP
650.0000 mg | RECTAL | Status: DC | PRN
  Filled 2021-05-18: qty 1

## 2021-05-18 MED ORDER — CHLORHEXIDINE GLUCONATE 0.12 % MT SOLN
15.0000 mL | Freq: Once | OROMUCOSAL | Status: AC
  Administered 2021-05-18: 15 mL via OROMUCOSAL

## 2021-05-18 MED ORDER — ROCURONIUM BROMIDE 10 MG/ML (PF) SYRINGE
PREFILLED_SYRINGE | INTRAVENOUS | Status: DC | PRN
  Administered 2021-05-18 (×2): 20 mg via INTRAVENOUS
  Administered 2021-05-18: 50 mg via INTRAVENOUS
  Administered 2021-05-18: 20 mg via INTRAVENOUS

## 2021-05-18 MED ORDER — LIDOCAINE 2% (20 MG/ML) 5 ML SYRINGE
INTRAMUSCULAR | Status: DC | PRN
  Administered 2021-05-18: 80 mg via INTRAVENOUS
  Administered 2021-05-18: 20 mg via INTRAVENOUS

## 2021-05-18 MED ORDER — ORAL CARE MOUTH RINSE: 15.0000 mL | Freq: Once | OROMUCOSAL | Status: AC

## 2021-05-18 MED ORDER — KETAMINE HCL 10 MG/ML IJ SOLN
INTRAMUSCULAR | Status: DC | PRN
  Administered 2021-05-18: 30 mg via INTRAVENOUS

## 2021-05-18 MED ORDER — GABAPENTIN 300 MG PO CAPS
300.0000 mg | ORAL_CAPSULE | ORAL | Status: AC
  Administered 2021-05-18: 300 mg via ORAL
  Filled 2021-05-18: qty 1

## 2021-05-18 MED ORDER — MIDAZOLAM HCL 5 MG/5ML IJ SOLN
INTRAMUSCULAR | Status: DC | PRN
  Administered 2021-05-18: 2 mg via INTRAVENOUS

## 2021-05-18 MED ORDER — DEXAMETHASONE SODIUM PHOSPHATE 10 MG/ML IJ SOLN
INTRAMUSCULAR | Status: AC
  Filled 2021-05-18: qty 1

## 2021-05-18 MED ORDER — SODIUM CHLORIDE 0.9 % IV SOLN: 250.0000 mL | INTRAVENOUS | Status: DC | PRN

## 2021-05-18 MED ORDER — SODIUM CHLORIDE 0.9% FLUSH: 3.0000 mL | INTRAVENOUS | Status: DC | PRN

## 2021-05-18 MED ORDER — ONDANSETRON HCL 4 MG/2ML IJ SOLN
INTRAMUSCULAR | Status: AC
  Filled 2021-05-18: qty 2

## 2021-05-18 MED ORDER — BUPIVACAINE LIPOSOME 1.3 % IJ SUSP
INTRAMUSCULAR | Status: DC | PRN
  Administered 2021-05-18: 20 mL

## 2021-05-18 MED ORDER — OXYCODONE HCL 5 MG PO TABS
ORAL_TABLET | ORAL | Status: AC
  Administered 2021-05-18: 5 mg via ORAL
  Filled 2021-05-18: qty 1

## 2021-05-18 MED ORDER — AMISULPRIDE (ANTIEMETIC) 5 MG/2ML IV SOLN: 10.0000 mg | Freq: Once | INTRAVENOUS | Status: DC | PRN

## 2021-05-18 MED ORDER — ACETAMINOPHEN 325 MG PO TABS: 650.0000 mg | ORAL_TABLET | ORAL | Status: DC | PRN

## 2021-05-18 MED ORDER — LIDOCAINE 20MG/ML (2%) 15 ML SYRINGE OPTIME
INTRAMUSCULAR | Status: DC | PRN
  Administered 2021-05-18: 1.5 mg/kg/h via INTRAVENOUS

## 2021-05-18 MED ORDER — BUPIVACAINE LIPOSOME 1.3 % IJ SUSP: 20.0000 mL | Freq: Once | INTRAMUSCULAR | Status: DC

## 2021-05-18 MED ORDER — ACETAMINOPHEN 500 MG PO TABS
1000.0000 mg | ORAL_TABLET | ORAL | Status: AC
  Administered 2021-05-18: 1000 mg via ORAL
  Filled 2021-05-18: qty 2

## 2021-05-18 MED ORDER — ONDANSETRON HCL 4 MG/2ML IJ SOLN
INTRAMUSCULAR | Status: DC | PRN
  Administered 2021-05-18: 4 mg via INTRAVENOUS

## 2021-05-18 MED ORDER — PROPOFOL 10 MG/ML IV BOLUS
INTRAVENOUS | Status: AC
  Filled 2021-05-18: qty 40

## 2021-05-18 MED ORDER — LACTATED RINGERS IV SOLN: INTRAVENOUS | Status: DC

## 2021-05-18 SURGICAL SUPPLY — 35 items
BENZOIN TINCTURE PRP APPL 2/3 (GAUZE/BANDAGES/DRESSINGS) ×2 IMPLANT
CABLE HIGH FREQUENCY MONO STRZ (ELECTRODE) ×2 IMPLANT
CHLORAPREP W/TINT 26 (MISCELLANEOUS) ×2 IMPLANT
COVER SURGICAL LIGHT HANDLE (MISCELLANEOUS) ×2 IMPLANT
DERMABOND ADVANCED (GAUZE/BANDAGES/DRESSINGS) ×1
DERMABOND ADVANCED .7 DNX12 (GAUZE/BANDAGES/DRESSINGS) ×1 IMPLANT
DEVICE SECURE STRAP 25 ABSORB (INSTRUMENTS) ×4 IMPLANT
DRSG TEGADERM 4X4.75 (GAUZE/BANDAGES/DRESSINGS) ×2 IMPLANT
ELECT REM PT RETURN 15FT ADLT (MISCELLANEOUS) ×2 IMPLANT
GAUZE SPONGE 4X4 12PLY STRL (GAUZE/BANDAGES/DRESSINGS) ×2 IMPLANT
GLOVE SURG ENC MOIS LTX SZ6 (GLOVE) ×2 IMPLANT
GLOVE SURG MICRO LTX SZ6 (GLOVE) ×2 IMPLANT
GLOVE SURG UNDER LTX SZ6.5 (GLOVE) ×2 IMPLANT
GOWN STRL REUS W/TWL LRG LVL3 (GOWN DISPOSABLE) ×2 IMPLANT
GOWN STRL REUS W/TWL XL LVL3 (GOWN DISPOSABLE) ×4 IMPLANT
GRASPER SUT TROCAR 14GX15 (MISCELLANEOUS) ×2 IMPLANT
KIT BASIN OR (CUSTOM PROCEDURE TRAY) ×2 IMPLANT
KIT TURNOVER KIT A (KITS) ×2 IMPLANT
MARKER SKIN DUAL TIP RULER LAB (MISCELLANEOUS) ×2 IMPLANT
MESH VENTRALIGHT ST 4X6IN (Mesh General) ×2 IMPLANT
PENCIL SMOKE EVACUATOR (MISCELLANEOUS) ×2 IMPLANT
SCISSORS LAP 5X35 DISP (ENDOMECHANICALS) ×2 IMPLANT
SET TUBE SMOKE EVAC HIGH FLOW (TUBING) ×2 IMPLANT
SHEARS HARMONIC ACE PLUS 36CM (ENDOMECHANICALS) ×2 IMPLANT
SLEEVE ENDOPATH XCEL 5M (ENDOMECHANICALS) ×2 IMPLANT
SLEEVE XCEL OPT CAN 5 100 (ENDOMECHANICALS) ×2 IMPLANT
STRIP CLOSURE SKIN 1/2X4 (GAUZE/BANDAGES/DRESSINGS) ×2 IMPLANT
SUT ETHIBOND 0 (SUTURE) ×8 IMPLANT
SUT MNCRL AB 4-0 PS2 18 (SUTURE) ×2 IMPLANT
SUT VIC AB 3-0 54XBRD REEL (SUTURE) ×1 IMPLANT
SUT VIC AB 3-0 BRD 54 (SUTURE) ×1
TOWEL OR 17X26 10 PK STRL BLUE (TOWEL DISPOSABLE) ×2 IMPLANT
TRAY LAPAROSCOPIC (CUSTOM PROCEDURE TRAY) ×2 IMPLANT
TROCAR BLADELESS OPT 5 100 (ENDOMECHANICALS) ×2 IMPLANT
TROCAR XCEL 12X100 BLDLESS (ENDOMECHANICALS) ×2 IMPLANT

## 2021-05-18 NOTE — Anesthesia Preprocedure Evaluation (Signed)
Anesthesia Evaluation  Patient identified by MRN, date of birth, ID band Patient awake    Reviewed: Allergy & Precautions, NPO status , Patient's Chart, lab work & pertinent test results  Airway Mallampati: II  TM Distance: >3 FB Neck ROM: Full    Dental  (+) Dental Advisory Given   Pulmonary asthma ,    breath sounds clear to auscultation       Cardiovascular hypertension, Pt. on medications  Rhythm:Regular Rate:Normal     Neuro/Psych negative neurological ROS     GI/Hepatic negative GI ROS, Neg liver ROS,   Endo/Other  negative endocrine ROS  Renal/GU negative Renal ROS     Musculoskeletal  (+) Arthritis ,   Abdominal   Peds  Hematology negative hematology ROS (+)   Anesthesia Other Findings   Reproductive/Obstetrics                             Lab Results  Component Value Date   WBC 9.4 05/11/2021   HGB 14.4 05/11/2021   HCT 41.9 05/11/2021   MCV 93.3 05/11/2021   PLT 304 05/11/2021   Lab Results  Component Value Date   CREATININE 0.99 05/11/2021   BUN 27 (H) 05/11/2021   NA 137 05/11/2021   K 2.9 (L) 05/11/2021   CL 102 05/11/2021   CO2 25 05/11/2021    Anesthesia Physical Anesthesia Plan  ASA: 2  Anesthesia Plan: General   Post-op Pain Management:    Induction: Intravenous  PONV Risk Score and Plan: 3 and Midazolam, Dexamethasone, Ondansetron and Treatment may vary due to age or medical condition  Airway Management Planned: Oral ETT  Additional Equipment: None  Intra-op Plan:   Post-operative Plan: Extubation in OR  Informed Consent: I have reviewed the patients History and Physical, chart, labs and discussed the procedure including the risks, benefits and alternatives for the proposed anesthesia with the patient or authorized representative who has indicated his/her understanding and acceptance.     Dental advisory given  Plan Discussed with:  CRNA  Anesthesia Plan Comments:         Anesthesia Quick Evaluation

## 2021-05-18 NOTE — Interval H&P Note (Signed)
History and Physical Interval Note:  05/18/2021 7:23 AM  Francisco Ramos  has presented today for surgery, with the diagnosis of INCARCERATED/RECURRENT VENTRAL HERNIA.  The various methods of treatment have been discussed with the patient and family. After consideration of risks, benefits and other options for treatment, the patient has consented to  Procedure(s): LAPAROSCOPIC VENTRAL HERNIA REPAIR WITH MESH (N/A) as a surgical intervention.  The patient's history has been reviewed, patient examined, no change in status, stable for surgery.  I have reviewed the patient's chart and labs.  Questions were answered to the patient's satisfaction.     [name]

## 2021-05-18 NOTE — Transfer of Care (Signed)
Immediate Anesthesia Transfer of Care Note  Patient: Francisco Ramos  Procedure(s) Performed: LAPAROSCOPIC VENTRAL HERNIA REPAIR WITH MESH (Abdomen)  Patient Location: PACU  Anesthesia Type:General  Level of Consciousness: awake and alert   Airway & Oxygen Therapy: Patient Spontanous Breathing and Patient connected to face mask oxygen  Post-op Assessment: report to rn VSS stable  Post vital signs: Reviewed and stable  Last Vitals:  Vitals Value Taken Time  BP    Temp    Pulse 72 05/18/21 1053  Resp    SpO2 95 % 05/18/21 1053  Vitals shown include unvalidated device data.  Last Pain:  Vitals:   05/18/21 0703  TempSrc:   PainSc: 0-No pain         Complications: No notable events documented.

## 2021-05-18 NOTE — Discharge Instructions (Addendum)
HERNIA REPAIR: POST OP INSTRUCTIONS   EAT Gradually transition to a high fiber diet with a fiber supplement over the next few weeks after discharge.  Start with a pureed / full liquid diet (see below)  WALK Walk an hour a day (cumulative- not all at once).  Control your pain to do that.    CONTROL PAIN Control pain so that you can walk, sleep, tolerate sneezing/coughing, and go up/down stairs.  HAVE A BOWEL MOVEMENT DAILY Keep your bowels regular to avoid problems.  OK to try a laxative to override constipation.  OK to use an antidairrheal to slow down diarrhea.  Call if not better after 2 tries  CALL IF YOU HAVE PROBLEMS/CONCERNS Call if you are still struggling despite following these instructions. Call if you have concerns not answered by these instructions  ######################################################################    DIET: Follow a light bland diet & liquids the first 24 hours after arrival home, such as soup, liquids, starches, etc.  Be sure to drink plenty of fluids.  Quickly advance to a usual solid diet within a few days.  Avoid fast food or heavy meals as your are more likely to get nauseated or have irregular bowels.  A low-sugar, high-fiber diet for the rest of your life is ideal.   Take your usually prescribed home medications unless otherwise directed.  PAIN CONTROL: Pain is best controlled by a usual combination of three different methods TOGETHER: Ice/Heat Over the counter pain medication Prescription pain medication Most patients will experience some swelling and bruising around the hernia(s) such as the bellybutton, groins, or old incisions.  Ice packs or heating pads (30-60 minutes up to 6 times a day) will help. Use ice for the first few days to help decrease swelling and bruising, then switch to heat to help relax tight/sore spots and speed recovery.  Some people prefer to use ice alone, heat alone, alternating between ice & heat.  Experiment to what  works for you.  Swelling and bruising can take several weeks to resolve.   It is helpful to take an over-the-counter pain medication regularly for the first days: Naproxen (Aleve, etc)  Two 220mg tabs twice a day OR Ibuprofen (Advil, etc) Three 200mg tabs four times a day (every meal & bedtime) AND Acetaminophen (Tylenol, etc) 325-650mg four times a day (every meal & bedtime) A  prescription for pain medication should be given to you upon discharge.  Take your pain medication as prescribed, IF NEEDED.  If you are having problems/concerns with the prescription medicine (does not control pain, nausea, vomiting, rash, itching, etc), please call us [phone] to see if we need to switch you to a different pain medicine that will work better for you and/or control your side effect better. If you need a refill on your pain medication, please contact your pharmacy.  They will contact our office to request authorization. Prescriptions will not be filled after 5 pm or on week-ends.  Avoid getting constipated.  Between the surgery and the pain medications, it is common to experience some constipation.  Increasing fluid intake and taking a fiber supplement (such as Metamucil, Citrucel, FiberCon, MiraLax, etc) 1-2 times a day regularly will usually help prevent this problem from occurring.  A mild laxative (prune juice, Milk of Magnesia, MiraLax, etc) should be taken according to package directions if there are no bowel movements after 48 hours.    Wash / shower every day, starting 2 days after surgery.  You may shower over the   steri strips which are waterproof.    Remove your outer bandage 2 days after surgery.  Steri-Strips will peel off after 1 to 2 weeks.  You may leave the incision open to air.  You may replace a dressing/Band-Aid to cover an incision for comfort if you wish.  Continue to shower over incision(s) after the dressing is off.  ACTIVITIES as tolerated:   You may resume regular (light) daily  activities beginning the next day--such as daily self-care, walking, climbing stairs--gradually increasing activities as tolerated.  Control your pain so that you can walk an hour a day.  If you can walk 30 minutes without difficulty, it is safe to try more intense activity such as jogging, treadmill, bicycling, low-impact aerobics, swimming, etc. Refrain from the most intensive and strenuous activity such as sit-ups, heavy lifting, contact sports, etc  Refrain from any heavy lifting or straining until 6 weeks after surgery.   DO NOT PUSH THROUGH PAIN.  Let pain be your guide: If it hurts to do something, don't do it.  Pain is your body warning you to avoid that activity for another week until the pain goes down. You may drive when you are no longer taking prescription pain medication, you can comfortably wear a seatbelt, and you can safely maneuver your car and apply brakes. You may have sexual intercourse when it is comfortable.   FOLLOW UP in our office Please call [facility] at [phone] to set up an appointment to see your surgeon in the office for a follow-up appointment approximately 2-3 weeks after your surgery. Make sure that you call for this appointment the day you arrive home to insure a convenient appointment time.  9.  If you have disability of FMLA / Family leave forms, please bring the forms to the office for processing.  (do not give to your surgeon).  WHEN TO CALL us [phone]: Poor pain control Reactions / problems with new medications (rash/itching, nausea, etc)  Fever over 101.5 F (38.5 C) Inability to urinate Nausea and/or vomiting Worsening swelling or bruising Continued bleeding from incision. Increased pain, redness, or drainage from the incision   The clinic staff is available to answer your questions during regular business hours (8:30am-5pm).  Please don't hesitate to call and ask to speak to one of our nurses for clinical concerns.   If you have a medical  emergency, go to the nearest emergency room or call 911.  A surgeon from [facility] is always on call at the hospitals in [facility], [place] [address], [address], [place], [place]  [address] ?  [address] [address], [place], [place]   [address] MAIN: [phone] ? TOLL FREE: [phone] ? FAX: [phone] [email or web address]

## 2021-05-18 NOTE — Anesthesia Procedure Notes (Signed)
Date/Time: 05/18/2021 10:45 AM Performed by: Wynonia Sours, CRNA Oxygen Delivery Method: Simple face mask Placement Confirmation: positive ETCO2 and breath sounds checked- equal and bilateral Dental Injury: Teeth and Oropharynx as per pre-operative assessment

## 2021-05-18 NOTE — Anesthesia Procedure Notes (Addendum)
Procedure Name: Intubation Date/Time: 05/18/2021 9:09 AM Performed by: Wynonia Sours, CRNA Pre-anesthesia Checklist: Patient identified, Emergency Drugs available, Suction available, Patient being monitored and Timeout performed Patient Re-evaluated:Patient Re-evaluated prior to induction Oxygen Delivery Method: Circle system utilized Preoxygenation: Pre-oxygenation with 100% oxygen Induction Type: IV induction Ventilation: Mask ventilation without difficulty Laryngoscope Size: Glidescope and 4 Grade View: Grade I Tube type: Oral Tube size: 7.5 mm Number of attempts: 1 Airway Equipment and Method: Rigid stylet and Video-laryngoscopy Placement Confirmation: ETT inserted through vocal cords under direct vision, positive ETCO2, CO2 detector and breath sounds checked- equal and bilateral Secured at: 23 cm Tube secured with: Tape Dental Injury: Teeth and Oropharynx as per pre-operative assessment  Difficulty Due To: Difficulty was anticipated, Difficult Airway- due to reduced neck mobility and Difficult Airway- due to limited oral opening Comments: Elective glidescope utilized d/t hx of neck fusion and limited motion of neck and limited mouth opening. Grade 1 with lopro #4 and 1 attempt with success. Head,neck and spine maintained in neutral alignment throughout procedure.

## 2021-05-18 NOTE — Op Note (Signed)
Operative Note  Ej Pinson  [id number]  [id number]  05/18/2021   Surgeon: Phylliss Blakes MD   Procedure performed: Laparoscopic assisted repair of incarcerated recurrent umbilical hernia with mesh underlay, defect approximately 2.5 cm   Preop diagnosis: Incarcerated recurrent umbilical hernia Post-op diagnosis/intraop findings: Same   Specimens: no Retained items: no  EBL: minimal cc Complications: none   Description of procedure: After obtaining informed consent the patient was taken to the operating room and placed supine on operating room table where general endotracheal anesthesia was initiated, preoperative antibiotics were administered, SCDs applied, and a formal timeout was performed.  The abdomen was prepped and draped in usual sterile fashion.  Peritoneal access was gained with an optical entry in the left upper quadrant and insufflation to 15 mmHg ensued without incident.  On gross inspection there is no injury from her injury, there is a umbilical hernia containing incarcerated omentum.  Bilateral laparoscopic assisted tap blocks were performed with Exparel mixed with quarter percent Marcaine with epinephrine.  2 additional 47mm trochars were placed in the right and left lower quadrant under direct visualization.  An attempt was made to reduce the omentum but this was not feasible laparoscopically.  A small curvilinear infraumbilical incision was made and the soft tissues dissected with cautery until the hernia sac was skeletonized down to the level of the fascia.  The hernia sac was excised to the level of the fascia and the incarcerated omentum was excised with a combination of cautery and 3-0 Vicryl ties to ligate the remaining omentum.  The remaining omentum was reduced easily into the abdominal cavity.  The hernia defect was measured at approximately 2.5 cm and the abdominal wall was noted to be somewhat attenuated.  A 4 inch x 6 inch coated Ventralex mesh was selected, the rough  side marked for orientation and stay sutures of 0 Ethibond placed in the 4 cardinal directions.  This was rolled up and inserted into the abdominal cavity.  The hernia defect was closed transversely with interrupted figure-of-eight 0 Ethibonds.  The abdomen was then reinsufflated and the closure inspected and confirmed to be airtight without any entrapped structures.  Harmonic scalpel was used to take the falciform ligament off the abdominal wall cephalad several centimeters and the peritoneum/umbilical ligaments were taken down inferiorly several centimeters as well.  Through stab incisions, the laparoscopic suture passer was then used to bring the tails of the stay sutures through the abdominal wall in the 4 cardinal directions and these were tied down, securing the mesh in a transfascial manner.  The secure strap tacker was then used to further affix the mesh to the anterior abdominal wall with an outer ring and inner crown of tacks.  On completion the mesh is well secured to the anterior abdominal wall and there is no exposed rough surface.  The omentum was brought back down to cover the bowel.  The abdomen was then desufflated and the trochars removed.  The umbilical skin was sutured back down to the fascia with a 0 Vicryl.  Skin incisions were closed with subcuticular 4-0 Monocryl followed by benzoin, Steri-Strips and Band-Aids/sterile dressings.  The patient was then awakened, extubated and taken to PACU in stable condition.    All counts were correct at the completion of the case.

## 2021-05-19 ENCOUNTER — Encounter (HOSPITAL_COMMUNITY): Payer: Self-pay | Admitting: Surgery

## 2021-05-20 NOTE — Anesthesia Postprocedure Evaluation (Signed)
Anesthesia Post Note  Patient: Francisco Ramos  Procedure(s) Performed: LAPAROSCOPIC VENTRAL HERNIA REPAIR WITH MESH (Abdomen)     Patient location during evaluation: PACU Anesthesia Type: General Level of consciousness: awake and alert Pain management: pain level controlled Vital Signs Assessment: post-procedure vital signs reviewed and stable Respiratory status: spontaneous breathing, nonlabored ventilation, respiratory function stable and patient connected to nasal cannula oxygen Cardiovascular status: blood pressure returned to baseline and stable Postop Assessment: no apparent nausea or vomiting Anesthetic complications: no   No notable events documented.  Last Vitals:  Vitals:   05/18/21 1146 05/18/21 1156  BP: (!) 149/96 (!) 147/94  Pulse: 67 64  Resp: 15   Temp:    SpO2: 94% 95%    Last Pain:  Vitals:   05/18/21 1210  TempSrc:   PainSc: 9    Pain Goal:                   Francisco Ramos

## 2022-06-27 ENCOUNTER — Ambulatory Visit (INDEPENDENT_AMBULATORY_CARE_PROVIDER_SITE_OTHER): Payer: Medicare HMO | Admitting: Pulmonary Disease

## 2022-06-27 ENCOUNTER — Encounter: Payer: Self-pay | Admitting: Pulmonary Disease

## 2022-06-27 ENCOUNTER — Ambulatory Visit (INDEPENDENT_AMBULATORY_CARE_PROVIDER_SITE_OTHER): Payer: Medicare HMO

## 2022-06-27 VITALS — BP 118/76 | HR 96 | Temp 98.7°F | Ht 70.0 in | Wt 171.0 lb

## 2022-06-27 DIAGNOSIS — J4522 Mild intermittent asthma with status asthmaticus: Secondary | ICD-10-CM

## 2022-06-27 LAB — NITRIC OXIDE: Nitric Oxide: 12

## 2022-06-27 MED ORDER — AIRSUPRA 90-80 MCG/ACT IN AERO: 2.0000 | INHALATION_SPRAY | Freq: Four times a day (QID) | RESPIRATORY_TRACT | 2 refills | Status: DC | PRN

## 2022-06-27 NOTE — Patient Instructions (Signed)
Mild intermittent asthma: Stop Symbicort Start Airsupra 2 sprays as needed for shortness of breath Exhaled nitric oxide test today Chest x-ray today If you develop increasing shortness of breath or find that you are having to use the Airsupra more than twice a week please let me know and then we will need to start the Symbicort again  We will see you back in 6 months or sooner if needed

## 2022-06-27 NOTE — Addendum Note (Signed)
Addended by: Christen Butter on: 06/27/2022 12:12 PM   Modules accepted: Orders
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
## 2022-06-28 ENCOUNTER — Telehealth: Payer: Self-pay | Admitting: Pulmonary Disease

## 2022-06-28 NOTE — Telephone Encounter (Signed)
[facility] @ [phone], Starla, is calling for some notes on appt Kim was supposed to Fax yesterday for 3 PT. Nothing recd.  Needs:   Appt Confirmation that they have upcoming appts.   FAX # [phone]  Please send. TY 

## 2022-06-28 NOTE — Telephone Encounter (Signed)
Called patient and he states that his Francisco Ramos is needing a prior auth from [employer]. But he is wanting something else to do in the mean time.   Ladies can we start this Prior auth for this patient. I also advised patient that since it is a holiday I was unsure how long the wait time will be for a PA to be approved or not.   Dr Kendrick Fries would you like to prescribe anything in the mean time while we wait for the PA.   Please advise

## 2022-06-28 NOTE — Telephone Encounter (Signed)
Called [facility] @ [phone] to clarify what they needed faxed to them. No one answered and the voicemailbox was not set up. Will try again later.

## 2022-06-28 NOTE — Telephone Encounter (Signed)
Pt states Francisco Ramos was sent to wrong pharmacy- Needs to go to [employer] needs prior authorization for the [place]. Please call patient with an update. Patient needs inhaler, wants to know how long PA will take and what to do in the meantime

## 2022-06-28 NOTE — Telephone Encounter (Signed)
Spoke with [facility] @ [facility] and she stated that she needed to know the date of the PT's next appointment. Pt is returning to office in 6 months but the schedules are not out that far so he will receive a letter and a call when closer to that time. Nothing further needed.

## 2022-07-02 ENCOUNTER — Telehealth: Payer: Self-pay

## 2022-07-02 NOTE — Telephone Encounter (Signed)
PA requesting information if patient has had therapeutic failure to Ventolin? Will be updated in additional encounter.

## 2022-07-02 NOTE — Telephone Encounter (Signed)
PA request received for Airsupra 90-80MCG/ACT aerosol via [facility] through [place].   PA not submitted due to further information needed.   Key: [id number]  PA requesting answer if patient has had therapeutic failure to Ventolin.  Please advise.

## 2022-07-04 MED ORDER — AIRSUPRA 90-80 MCG/ACT IN AERO: 2.0000 | INHALATION_SPRAY | Freq: Four times a day (QID) | RESPIRATORY_TRACT | 6 refills | Status: AC | PRN

## 2022-07-04 NOTE — Telephone Encounter (Signed)
Called and spoke to patient and let him know his chest xray results. He verified understanding. Also while on the phone I went over his PA was approved. He asked for a 90 day supply for his inhaler and went over pharmacy. Nothing further needed

## 2022-07-04 NOTE — Telephone Encounter (Signed)
Francisco Ramos, CPhT      07/04/22  9:42 AM Note PA APPROVED through 07/08/2023     Called patient but he did not answer. Left message for patient to call back.

## 2022-07-04 NOTE — Telephone Encounter (Signed)
Patient is returning a missed call.  Please call patient back.  He stated he has a few questions that he needs to nurse to assist him with.  CB# [phone]

## 2022-07-04 NOTE — Telephone Encounter (Signed)
PA APPROVED through 07/08/2023

## 2022-07-04 NOTE — Telephone Encounter (Signed)
PA has been submitted with failure of Ventolin.

## 2022-11-21 ENCOUNTER — Ambulatory Visit: Payer: Medicare HMO | Admitting: Physical Therapy

## 2023-10-01 ENCOUNTER — Other Ambulatory Visit (HOSPITAL_COMMUNITY): Payer: Self-pay | Admitting: Gastroenterology

## 2023-10-01 DIAGNOSIS — R634 Abnormal weight loss: Secondary | ICD-10-CM

## 2023-10-01 DIAGNOSIS — R109 Unspecified abdominal pain: Secondary | ICD-10-CM

## 2023-10-01 DIAGNOSIS — R1013 Epigastric pain: Secondary | ICD-10-CM

## 2023-10-08 ENCOUNTER — Ambulatory Visit (HOSPITAL_COMMUNITY)

## 2023-10-16 ENCOUNTER — Ambulatory Visit (HOSPITAL_COMMUNITY)
Admission: RE | Admit: 2023-10-16 | Discharge: 2023-10-16 | Disposition: A | Discharge status: Home / Self Care | Source: Ambulatory Visit | Attending: Gastroenterology | Admitting: Gastroenterology

## 2023-10-16 DIAGNOSIS — R109 Unspecified abdominal pain: Secondary | ICD-10-CM | POA: Insufficient documentation

## 2023-10-16 DIAGNOSIS — R1013 Epigastric pain: Secondary | ICD-10-CM | POA: Diagnosis present

## 2023-10-16 DIAGNOSIS — R634 Abnormal weight loss: Secondary | ICD-10-CM | POA: Insufficient documentation

## 2023-10-16 MED ORDER — IOHEXOL 300 MG/ML  SOLN
100.0000 mL | Freq: Once | INTRAMUSCULAR | Status: AC | PRN
  Administered 2023-10-16: 100 mL via INTRAVENOUS
